# Patient Record
Sex: Female | Born: 1970
Health system: Southern US, Community
[De-identification: ages and names within clinical notes are randomized; demographics above are authoritative.]

## PROBLEM LIST (undated history)

## (undated) DIAGNOSIS — O24419 Gestational diabetes mellitus in pregnancy, unspecified control: Secondary | ICD-10-CM

## (undated) DIAGNOSIS — E049 Nontoxic goiter, unspecified: Secondary | ICD-10-CM

## (undated) DIAGNOSIS — D259 Leiomyoma of uterus, unspecified: Secondary | ICD-10-CM

## (undated) DIAGNOSIS — D649 Anemia, unspecified: Secondary | ICD-10-CM

## (undated) HISTORY — DX: Leiomyoma of uterus, unspecified: D25.9

## (undated) HISTORY — DX: Gestational diabetes mellitus in pregnancy, unspecified control: O24.419

## (undated) HISTORY — DX: Anemia, unspecified: D64.9

## (undated) HISTORY — DX: Nontoxic goiter, unspecified: E04.9

---

## 2000-08-21 ENCOUNTER — Encounter: Admission: RE | Admit: 2000-08-21 | Discharge: 2000-08-21 | Payer: Self-pay | Admitting: Internal Medicine

## 2000-12-28 ENCOUNTER — Encounter: Admission: RE | Admit: 2000-12-28 | Discharge: 2000-12-28 | Payer: Self-pay | Admitting: Internal Medicine

## 2001-04-21 ENCOUNTER — Other Ambulatory Visit: Admission: RE | Admit: 2001-04-21 | Discharge: 2001-04-21 | Payer: Self-pay | Admitting: Obstetrics and Gynecology

## 2001-09-06 ENCOUNTER — Encounter: Admission: RE | Admit: 2001-09-06 | Discharge: 2001-09-06 | Payer: Self-pay

## 2002-01-14 ENCOUNTER — Ambulatory Visit (HOSPITAL_COMMUNITY): Admission: AD | Admit: 2002-01-14 | Discharge: 2002-01-14 | Payer: Self-pay | Admitting: Obstetrics and Gynecology

## 2002-01-14 ENCOUNTER — Encounter (INDEPENDENT_AMBULATORY_CARE_PROVIDER_SITE_OTHER): Payer: Self-pay | Admitting: Specialist

## 2002-05-27 ENCOUNTER — Other Ambulatory Visit: Admission: RE | Admit: 2002-05-27 | Discharge: 2002-05-27 | Payer: Self-pay | Admitting: Obstetrics and Gynecology

## 2002-11-08 ENCOUNTER — Encounter: Admission: RE | Admit: 2002-11-08 | Discharge: 2002-11-08 | Payer: Self-pay | Admitting: Obstetrics and Gynecology

## 2002-12-02 ENCOUNTER — Inpatient Hospital Stay (HOSPITAL_COMMUNITY): Admission: AD | Admit: 2002-12-02 | Discharge: 2002-12-04 | Payer: Self-pay | Admitting: Obstetrics and Gynecology

## 2004-10-20 ENCOUNTER — Observation Stay (HOSPITAL_COMMUNITY): Admission: EM | Admit: 2004-10-20 | Discharge: 2004-10-21 | Payer: Self-pay | Admitting: Emergency Medicine

## 2004-10-20 IMAGING — CT CT PELVIS W/ CM
1 of 3 series · 15 of 32 positions shown, 20 images · IV contrast (omnipaque)
Comparison: none

CLINICAL DATA: Nausea; vomiting; diarrhea
TECHNIQUE: Images are obtained of the abdomen and pelvis during the IV infusion of 100 cc Omnipaque 300.  The patient vomited just prior to the study. 
 CT ABDOMEN WITH CONTRAST:
 Lung bases are clear.  The liver, spleen, gallbladder, pancreas, adrenal glands, and kidneys appear to be within normal limits.  No evidence for bowel obstruction, free fluid, or inflammatory changes.

[Series 3: abd/pelvis 5.0 b30f · axial · 0.59mm/px · z∈[-708,-353]mm · 15 of 81 slices shown, 20 images]
[im 5/81  soft-tissue]
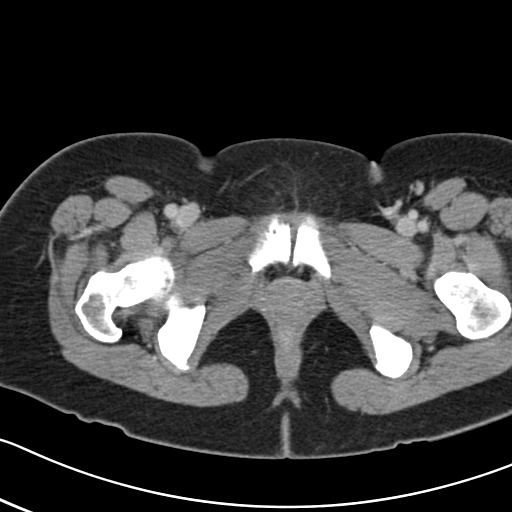
[im 5/81  bone]
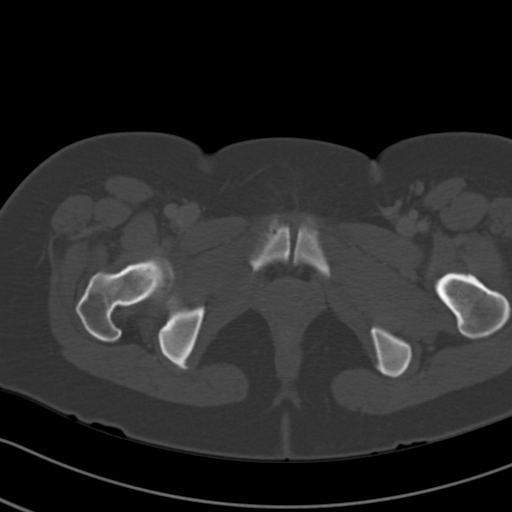
[im 9/81  soft-tissue]
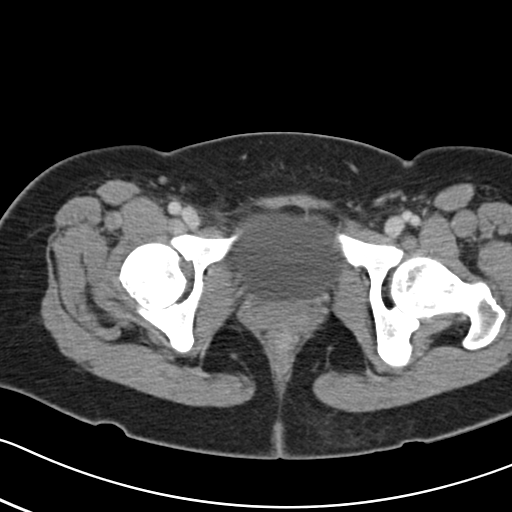
[im 17/81  soft-tissue]
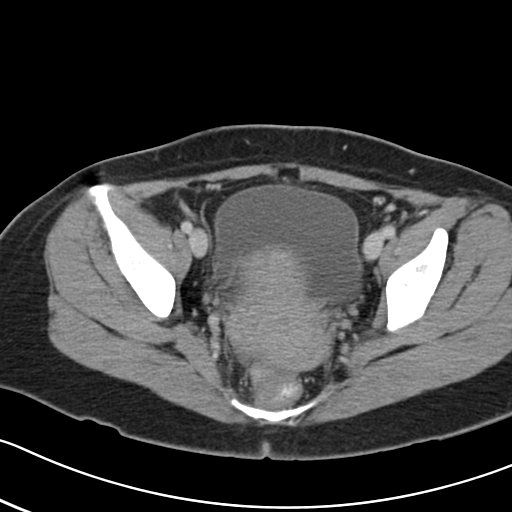
[im 22/81  soft-tissue]
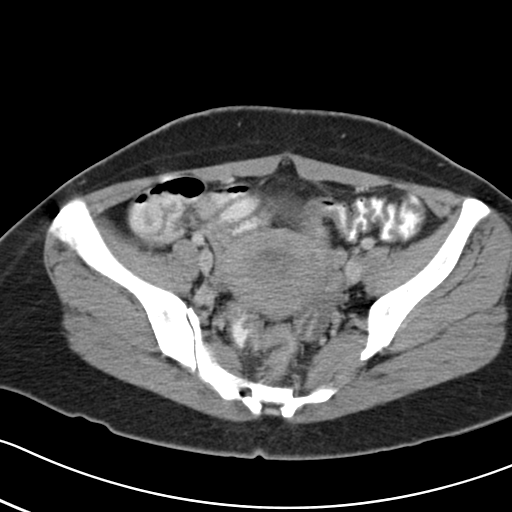
[im 26/81  soft-tissue]
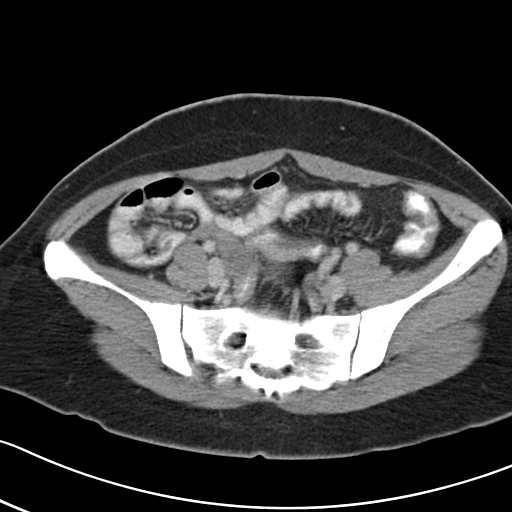
[im 34/81  soft-tissue]
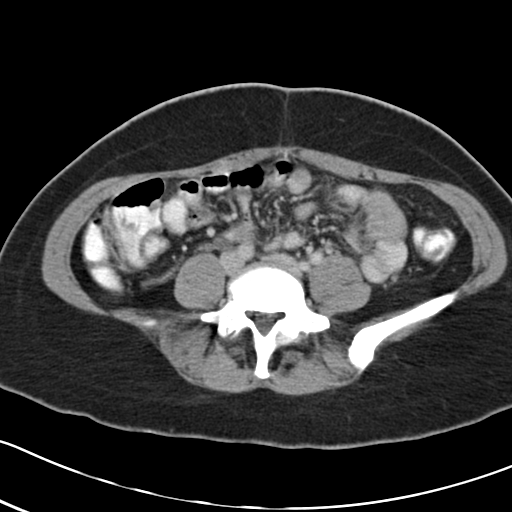
[im 38/81  soft-tissue]
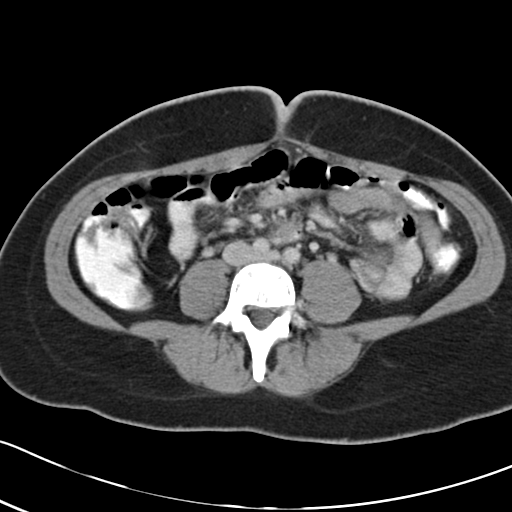
[im 43/81  soft-tissue]
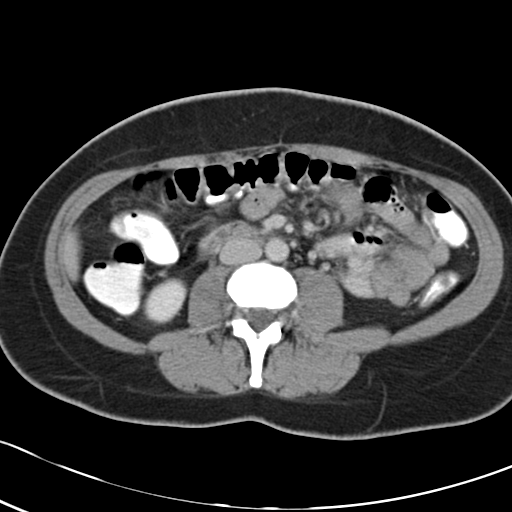
[im 47/81  soft-tissue]
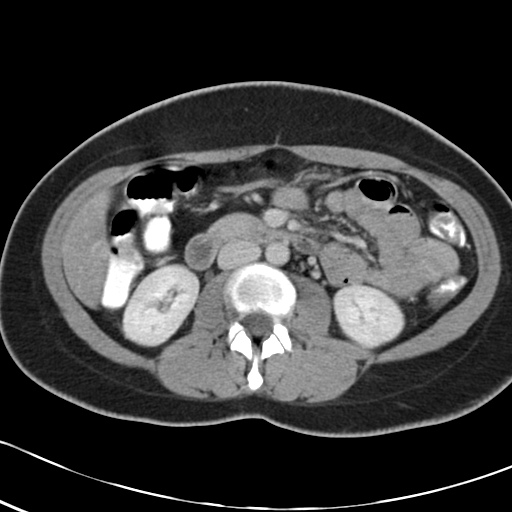
[im 47/81  bone]
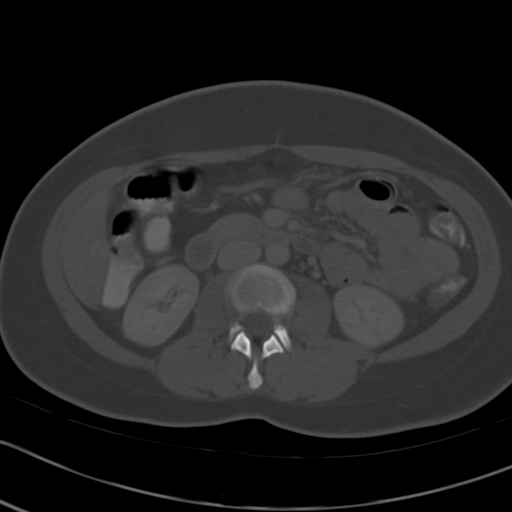
[im 55/81  soft-tissue]
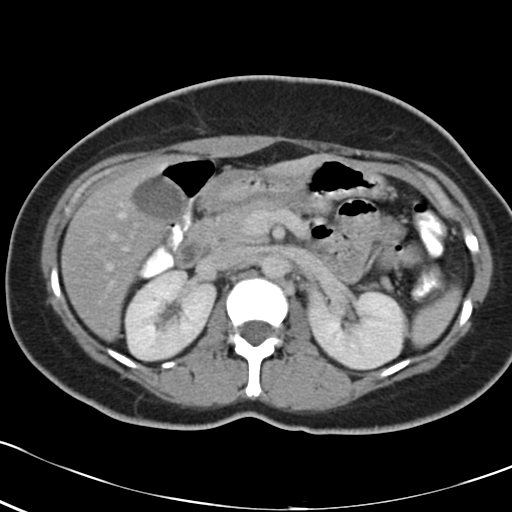
[im 59/81  soft-tissue]
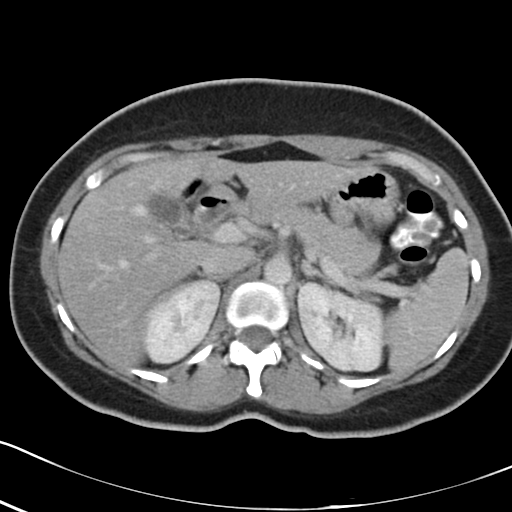
[im 64/81  soft-tissue]
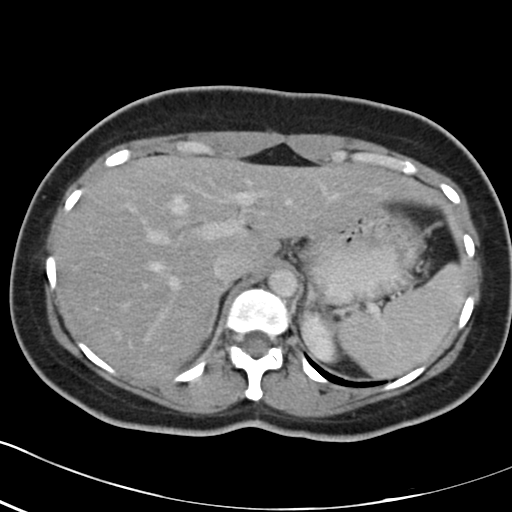
[im 64/81  lung]
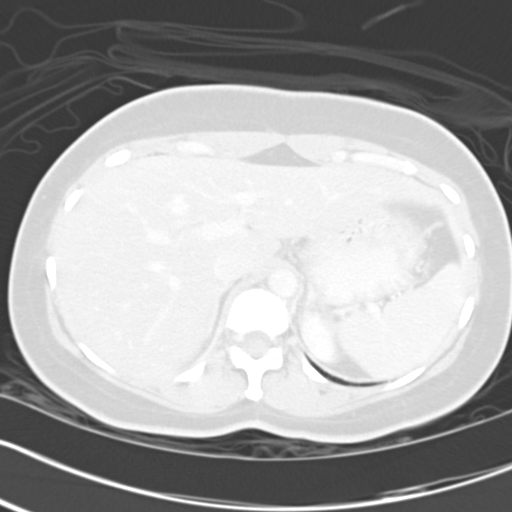
[im 68/81  lung]
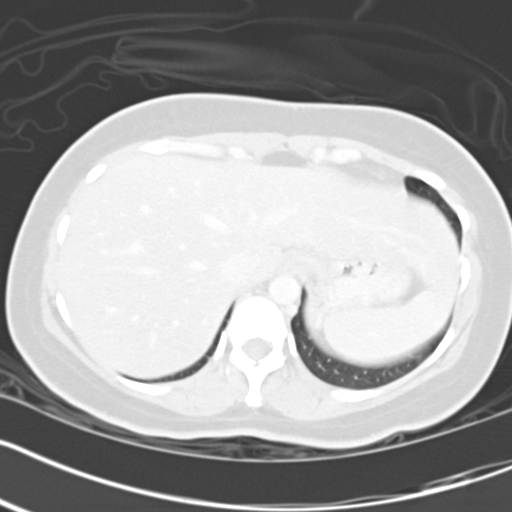
[im 72/81  soft-tissue]
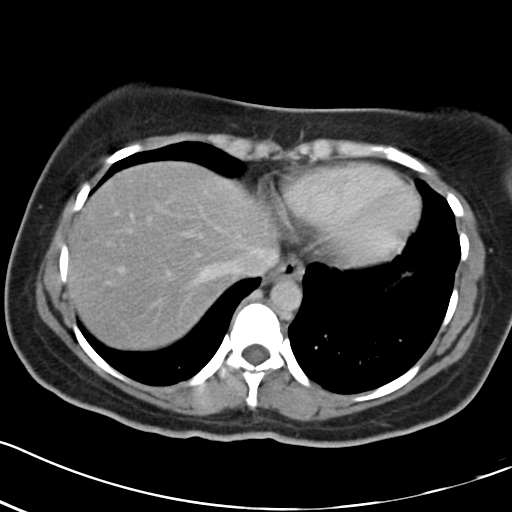
[im 72/81  lung]
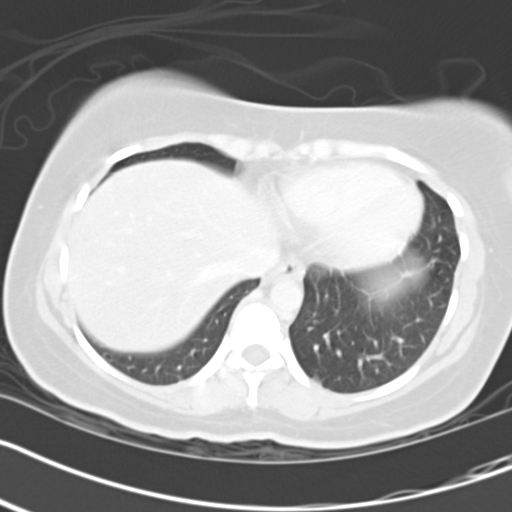
[im 76/81  soft-tissue]
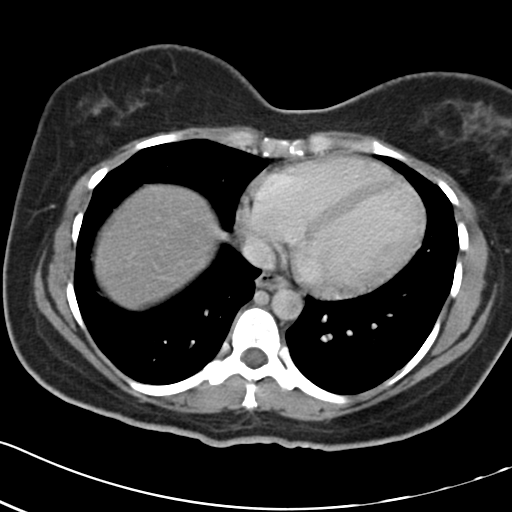
[im 76/81  lung]
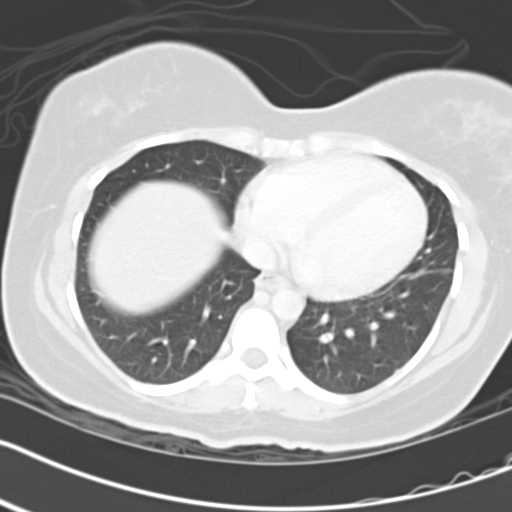

[15 of 32 positions shown; findings below may reference images not displayed]

IMPRESSION: No acute findings. 
 CT OF THE PELVIS WITH CONTRAST:
 The bladder, uterus, and adnexal regions appear normal.  A tiny amount of pelvic free fluid is present.  No inflammatory changes within the right lower quadrant and no evidence for bowel obstruction.
IMPRESSION: Tiny amount of pelvic free fluid.

## 2005-10-20 ENCOUNTER — Other Ambulatory Visit: Admission: RE | Admit: 2005-10-20 | Discharge: 2005-10-20 | Payer: Self-pay | Admitting: Obstetrics and Gynecology

## 2006-01-08 ENCOUNTER — Encounter: Admission: RE | Admit: 2006-01-08 | Discharge: 2006-04-08 | Payer: Self-pay | Admitting: Obstetrics and Gynecology

## 2006-04-16 ENCOUNTER — Inpatient Hospital Stay (HOSPITAL_COMMUNITY): Admission: AD | Admit: 2006-04-16 | Discharge: 2006-04-16 | Payer: Self-pay | Admitting: Obstetrics and Gynecology

## 2006-06-02 ENCOUNTER — Inpatient Hospital Stay (HOSPITAL_COMMUNITY): Admission: RE | Admit: 2006-06-02 | Discharge: 2006-06-04 | Payer: Self-pay | Admitting: Obstetrics and Gynecology

## 2009-11-14 ENCOUNTER — Encounter: Admission: RE | Admit: 2009-11-14 | Discharge: 2009-11-14 | Payer: Self-pay | Admitting: Family Medicine

## 2011-04-04 NOTE — H&P (Signed)
Brandy Ramsey, Brandy Ramsey NO.:  [id number]   MEDICAL RECORD NO.:  [id number]          PATIENT TYPE:  INP   LOCATION:                                FACILITY:  [facility]   PHYSICIAN:  Janine Limbo, M.D.DATE OF BIRTH:  May 21, 1971   DATE OF ADMISSION:  06/02/2006  DATE OF DISCHARGE:                                HISTORY & PHYSICAL   HISTORY OF PRESENT ILLNESS:  Patient is a 40 year old female, gravida 3,  para 1, 0-1-1, who presents at [redacted] weeks gestation (EDC is June 15, 2006).  The patient has been followed at the [facility] and  Gynecology Division of [facility].  The pregnancy has  been complicated by the fact that her age is greater than 35.  The patient  had a CVS performed in [place] that showed a healthy infant.  Chromosome  count was normal.  The patient also has a history of gestational diabetes,  and her blood sugars have been extremely well controlled.   OBSTETRICAL HISTORY:  The patient had a first trimester miscarriage in 2003.  In 2004, she had a vaginal delivery at term, where she delivered a 5 pound,  9 ounce female infant.   DRUG ALLERGIES:  No known drug allergies.   PAST MEDICAL HISTORY:  The patient has a history of diabetes during this  pregnancy, and she had gestational diabetes with her first pregnancy.  She  had her wisdom teeth removed.   SOCIAL HISTORY:  The patient denies cigarette use, alcohol use, and  recreational drug use.   REVIEW OF SYSTEMS:  Normal pregnancy complaints.   FAMILY HISTORY:  Noncontributory.   PHYSICAL EXAMINATION:  VITAL SIGNS:  Weight 144 pounds.  HEENT:  Within normal limits.  CHEST:  Clear.  HEART:  Regular rate and rhythm.  BREASTS:  Without masses.  ABDOMEN:  Gravid with a fundal height of 37 cm.  EXTREMITIES:  Grossly normal.  NEUROLOGIC:  Grossly normal.  PELVIC:  Cervix is 3 cm dilated, 50% effaced, and -2 station.   LABORATORY VALUES:  Blood type is O+.   Antibody screen negative.  VDRL  nonreactive.  Rubella positive.  HBSAG negative.  Pap is within normal  limits.  Third trimester beta strep is negative.   ASSESSMENT:  1.  A 38-week gestation.  2.  Well controlled gestational diabetes.   PLAN:  The patient will undergo induction of labor.  She understands the  indications for her procedure, and she accepts the associated risks.      Janine Limbo, M.D.  Electronically Signed     AVS/MEDQ  D:  06/01/2006  T:  06/01/2006  Job:  [id number]

## 2011-04-04 NOTE — Op Note (Signed)
[name]  Patient:    Brandy Ramsey, Brandy Ramsey Visit Number: [id number] MRN: [id number]          Service Type: DSU Location: [facility] Attending Physician:  Jaymes Graff A Dictated by:   Pierre Bali Normand Sloop, M.D. Proc. Date: 01/14/02 Admit Date:  01/14/2002                             Operative Report  DATE OF BIRTH:                June 10, 1971  PREOPERATIVE DIAGNOSES:       Missed abortion in the first trimester.  POSTOPERATIVE DIAGNOSES:      Missed abortion in the first trimester.  PROCEDURE:                    Dilatation and evacuation.  SURGEON:                      Naima A. Normand Sloop, M.D.  ANESTHESIA:                   MAC with 20 cc 1% cervical block.  INTRAVENOUS FLUIDS:           1000 cc Crystalloid.  ESTIMATED BLOOD LOSS:         Minimal.  PATHOLOGY:                    Products of conception.  COMPLICATIONS:                None.  FINDINGS:                     A 7 week sized uterus.  Normal adnexa bilaterally.  Vulva/vaginal examination is within normal limits.  Abdomen is soft and nontender.  DISPOSITION:                  Patient went to recovery room in stable condition.  PROCEDURE IN DETAIL:          Patient was taken to the operating room where she was given MAC anesthesia, placed in the dorsal lithotomy position, prepped and draped in a normal sterile fashion.  A bivalve speculum was placed into the vagina.  The anterior lip of the cervix was grasped with a single tooth tenaculum.  Cervical block was then done with 1% lidocaine, 20 cc total.  The cervix was then dilated with Shawnie Pons dilators up to a 21.  A size 7 suction curettage was then placed into the uterus.  Curettage was done until a gritty texture was noted and no more products of conception were seen, evacuating the uterus.  A sharp curettage was then placed into the uterine cavity and a gritty texture was noted.  It was noted to be a normal cavity.  All instruments  were removed from the vagina.  The tenaculum site was made hemostatic with pressure.  Sponge, lap, and instrument counts were correct x2. Before the bivalve speculum was placed the bladder was drained of about 50 cc of urine.  The patient was examined and noted to have a normal abdominal examination, a 7 week sized uterus with no adnexal masses.  Patient went to recovery room in stable condition. Dictated by:   Pierre Bali. Normand Sloop, M.D. Attending Physician:  Michael Litter DD:  01/14/02 TD:  01/14/02 Job: [id number]

## 2011-04-04 NOTE — H&P (Signed)
NAMEETHELEAN, COLLA NO.:  [id number]   MEDICAL RECORD NO.:  [id number]          PATIENT TYPE:  INP   LOCATION:  0474                         FACILITY:  [facility]   PHYSICIAN:  Lenon Curt. Chilton Si, M.D.  DATE OF BIRTH:  03-Nov-1971   DATE OF ADMISSION:  10/20/2004  DATE OF DISCHARGE:                                HISTORY & PHYSICAL   CHIEF COMPLAINT:  Nausea, vomiting, diarrhea.   HISTORY OF PRESENT ILLNESS:  A 40 year old female with a history of anemia  who presented to [facility] Emergency Department via EMS  complaining of nausea, vomiting, diarrhea that started this morning.  She  felt fine prior to this morning.  She denies any sick contacts at home and  has been exposed to C. difficile colitis and various other infections at the  hospital.  She denies any fevers but has had chills today.  She has been  unable to keep down foods and liquids at home and had lightheadedness with  standing from a seated position.  Her stools have been water and nonbloody.  She denies any abdominal pain but did have mild cramping.  She received  three liters of IV fluids in the emergency department and had some  hypotension.   She continued to have diarrhea despite medications given in the ED.   PAST MEDICAL HISTORY:  1.  Anemia.  2.  Thyromegaly with normal TSH.  3.  Astigmatism.   PAST SURGICAL HISTORY:  1.  D&C.  2.  Vaginal delivery.   MEDICATIONS:  1.  Iron sulfate.  2.  Imodium taken today.   EMERGENCY DEPARTMENT MEDICATIONS:  1.  Phenergan IV 3 L of normal saline.  2.  Tincture of opium.  3.  Two doses of Lomotil.   ALLERGIES:  No known drug allergies.   FAMILY HISTORY:  Mom has diabetes, hyperlipidemia, and hypertension.  Father  has hypertension.  She has three brothers who are all healthy.   SOCIAL HISTORY:  She is married with a 32-year-old daughter.  No sick  contacts at home.  She is a Development worker, community at [employer]  She is not a  smoker.   She denies any alcohol or recent travel.  She did have her flu shot  this year.   REVIEW OF SYMPTOMS:  CONSTITUTIONAL:  She denies having any fevers but did  have chills.  She has been nauseated but feeling better currently.  Positive  for vomiting with last episode at 9 p.m.  Positive for diarrhea x 20 today  which has been watery, nonmucosy and nonbloody.  Denies any headache,  melena.  Positive for mild abdominal cramping.  Positive for regular menses  with her last menstrual period three weeks ago.  It was heavy lasting seven  days.   PHYSICAL EXAMINATION:  VITAL SIGNS:  Temperature maximum is 99.2, pulse of  111 down to 94, respiratory rate of 18, blood pressure 112/74 down to 77/42  then to 105/59.  GENERAL:  A pleasant female in no acute distress in bed.  Alert and oriented  x 3.  HEENT:  Head is normocephalic and  atraumatic.  Wears glasses.  Pupils are  equal, round, and reactive.  Oropharynx pink and moist.  CARDIOVASCULAR:  Regular rate and rhythm without murmurs.  There are 2+  pulses.  NECK:  Supple without lymphadenopathy or thyromegaly.  LUNGS:  Clear to auscultation bilaterally and nonlabored.  No wheezing,  rales, or rhonchi.  ABDOMEN:  Soft, periumbilical tenderness to palpation.  No rebound,  guarding, or rigidity.  No hepatosplenomegaly.  No CVAT.  Hyperactive bowel  sounds.  SKIN:  No diaphoresis or pallor.  NEUROLOGICAL:  Cranial nerves II through XII grossly intact.  GENITOURINARY:  DRE with no stool in the rectal vault, hemoccult negative.   LABORATORY DATA:  Stool negative for WBC.  C. difficile and ovum parasites  are pending.  There are 0 to 2 wbc's and 0 to 2 rbc's.  Urine pregnancy is  negative.  Urinalysis is negative except for trace ketones and trace  leukocyte esterase.  Lipase is 23, amylase is 140.  CMET shows a sodium of  135, potassium of 4.4, chloride 107, bicarbonate 19, BUN 13, creatinine 0.9,  glucose of 129.  LFTs are all within normal  limits.  Albumin is 5, total  protein is 8.9, alkaline phosphate is 79, total bilirubin is 1.1.  White  count is 13.1, hemoglobin is 14.6, hematocrit is 44.7, platelet count is  340,000.  There are 92% neutrophils and 4% lymphocytes.   CT of the abdomen and pelvis shows a tiny amount of free fluid in the  pelvis.   ASSESSMENT:  A 40 year old female admitted for dehydration, nausea,  vomiting, and diarrhea.   PLAN:  1.  Dehydration:  Status post three liters of IV fluid in the emergency      room.  We will run her on maintenance IV fluids tonight and monitor      I&Os.  Monitor her blood pressure.  Slow p.o. intake encouraged.  2.  Nausea, vomiting, and diarrhea:  Differential diagnosis includes      gastroenteritis, infection colitis:  Infectious etiology is the likely      cause as she is young, healthy, and works in the health care industry.      She has been treated with Phenergan which has improved her nausea and      she had no emesis for four hours.  Her diarrhea continues to spike with      tincture of opium and two doses of Lomotil.  Look for infectious      etiology in the stool as her C. difficile and cultures are pending.      Consider starting treatment with Flagyl and Cipro.  3.  Hypotension:  This has improved with IV fluids.  This is most likely      secondary to volume loss from vomiting      and diarrhea with inadequate p.o. intake.  Continue to push IV fluids as      needed if the blood pressure continues to drop.  Sepsis is unlikely      given her afebrile status.  She does have mildly increased wbc's to      13,000.  4.  The patient will likely be discharged tomorrow if her blood pressure is      stable and able to keep in fluids.     Kare   KB/MEDQ  D:  10/21/2004  T:  10/21/2004  Job:  [id number]   cc:   Lenon Curt. Chilton Si, M.D.  [address]  [place]  [place] [phone]  Fax: [phone]

## 2011-04-04 NOTE — Discharge Summary (Signed)
   NAME:  Brandy Ramsey, Brandy Ramsey                     ACCOUNT NO.:  [id number]   MEDICAL RECORD NO.:  [id number]                   PATIENT TYPE:  INP   LOCATION:  9134                                 FACILITY:  [facility]   PHYSICIAN:  Naima A. Dillard, M.D.              DATE OF BIRTH:  02-Oct-1971   DATE OF ADMISSION:  12/02/2002  DATE OF DISCHARGE:  12/04/2002                                 DISCHARGE SUMMARY   ADMISSION DIAGNOSES:  1. Intrauterine pregnancy at term.  2. Premature rupture of membranes.  3. Gestational diabetes.   PROCEDURES:  1. Vacuum extraction vaginal delivery.  2. Repair of third-degree perineal laceration.   DISCHARGE DIAGNOSES:  1. Intrauterine pregnancy at term delivered.  2. Premature rupture of membranes.  3. Gestational diabetes.  4. Vacuum extraction vaginal delivery.  5. Repair of third-degree perineal laceration.  6. Maternal exhaustion.   HOSPITAL COURSE:  The patient is a 40 year old gravida 2, para 0-0-1-0 who  presented at 37 weeks with spontaneous rupture of membranes at home for  clear fluid.  Her labor was augmented with Pitocin and she progressed  normally to complete dilation and plus 3 station.  She pushed for three  hours with maternal exhaustion and she consented to a vacuum extractor  assistance for delivery.  This was accomplished by Naima A. Dillard, M.D.  with the birth of a 5 pound 9 ounce female infant with Apgar scores of 9 at  one minute and 9 at five minutes.  The patient has done quite well in the  immediate postpartum period.  Her hemoglobin on the first postpartum day was  9.7.  Her vital signs remained stable.  On her first postpartum day, her  fasting blood sugar was 126.  This a.m. it is 82.  She is judged to be in  satisfactory condition for discharge.   DISCHARGE INSTRUCTIONS:  Per [facility] handout.   DISCHARGE MEDICATIONS:  1. Motrin 600 mg p.o. q.6 h. p.r.n. pain.  2. Tylox 1-2 p.o. q.3-4 h. pain.  3. Prenatal  vitamins.   DISCHARGE FOLLOW UP:  Will be at [facility] in 6 weeks.     Rica Koyanagi, C.N.M.               Naima A. Normand Sloop, M.D.    SDM/MEDQ  D:  12/04/2002  T:  12/04/2002  Job:  [id number]

## 2011-04-04 NOTE — H&P (Signed)
NAME:  Brandy Ramsey, QUAST NO.:  [id number]   MEDICAL RECORD NO.:  [id number]                   PATIENT TYPE:  INP   LOCATION:  9162                                 [facility]ACILITY:  [facility]   PHYSICIAN:  Crist Fat. Rivard, M.D.              DATE OF BIRTH:  February 02, 1971   DATE OF ADMISSION:  12/02/2002  DATE OF DISCHARGE:                                HISTORY & PHYSICAL   HISTORY OF PRESENT ILLNESS:  This is a 40 year old gravida 2, para 0-0-1-0  at 37-1/7 weeks, who presents with complaints of gush of fluids at 3 o'clock  a.m. with a few cramps thereafter.  She reports positive fetal movement and  no bleeding.   PRESENT PREGNANCY:  The pregnancy has been followed by the M.D. service and  remarkable for:  1. Gestational diabetes.  2. The patient is an M.D.  3. Group B strep positive.   OBSTETRICAL HISTORY:  Remarkable for a missed AB in February 2003, with a  D&E.   PRENATAL LABS:  Hemoglobin 11.8, platelets 275, blood type 0 positive,  antibody screen negative.  Sickle cell declined.  RPR nonreactive.  Rubella  immune.  HBSAG negative.  Cultures negative.  A 3-hr GTT elevated.  AFP  within normal limits.  Group B strep positive.   PAST MEDICAL HISTORY:  Remarkable for:  1. Childhood varicella.  2. History of anemia.   PAST SURGICAL HISTORY:  Remarkable for wisdom tooth extraction and D&E.   FAMILY HISTORY:  Remarkable for both parents with hypertension.  Mother with  diabetes and an aunt with goiter.   GENETIC HISTORY:  Remarkable for the father of the baby's first cousin with  Down syndrome.   SOCIAL HISTORY:  The patient is married to [place], who is involved and  supportive.  She is Buddhist.  She denies any alcohol, tobacco or drug use.   OBJECTIVE DATA:  VITAL SIGNS:  Stable, afebrile.  HEENT:  Within normal limits.  NECK:  Thyroid normal and not enlarged.  CHEST:  Clear to auscultation.  HEART:  Regular rate and rhythm.  ABDOMEN:  Gravid at 37  cm; vertex to Leopold's.  AFM shows a reassuring  fetal heart rate, with occasional uterine contractions.  PELVIC:  Vaginal examination deferred, but there is gross evidence of  ruptured membranes with clear fluid in vernix.  EXTREMITIES:  Within normal limits.   LABORATORY DATA:  Fasting blood sugar at home this morning was 70.   ASSESSMENT:  1. Intrauterine pregnancy at 37-1/7 weeks.  2. Diet-controlled gestational diabetes.  3. Premature rupture of membranes at term.  4. Late in phase labor.   PLAN:  1. Admit to M.D. service, Dr. Dois Davenport Rivard notified.  2. Routine M.D. orders.  3. Briefly discussed expected management versus induction of labor, and the     patient states that she does not have a preference.  Dr. Jaymes Graff     will  assess her and make further orders.     Marie L. Williams, C.N.M.                 Crist Fat Rivard, M.D.    MLW/MEDQ  D:  12/02/2002  T:  12/02/2002  Job:  [id number]

## 2011-04-04 NOTE — Op Note (Signed)
   NAME:  Brandy Ramsey, Brandy Ramsey                     ACCOUNT NO.:  [id number]   MEDICAL RECORD NO.:  [id number]                   PATIENT TYPE:  INP   LOCATION:  9134                                 FACILITY:  [facility]   PHYSICIAN:  Naima A. Dillard, M.D.              DATE OF BIRTH:  [birth year]   DATE OF PROCEDURE:  12/02/2002  DATE OF DISCHARGE:                                 OPERATIVE REPORT   PROCEDURE:  Vacuum-assisted vaginal delivery.   DESCRIPTION OF PROCEDURE:  The patient progressed to complete, +3 station,  left occiput anterior.  The patient pushed for three hours but had maternal  exhaustion.  Her bladder was drained of 500 cubic centimeters, and she and  her husband were consented for a vacuum-assisted vaginal delivery.  They  were told the risks are but are not limited to bleeding, scalp injury,  cephalohematoma, intraventricular hemorrhage.  Patient and family agreed to  a vacuum-assisted vaginal delivery.  A Kiwi vacuum was placed in correct  position and after two pulls and no pop-offs at 500 mmHg in the green zone,  the head delivered to perineum, suction was released.  Mouth and nares were  bulb-suctioned.  No nuchal cord, no meconium.  Body and head in compound  presentation and delivered without difficulty.  Placenta delivered intact  with three-vessel cord.  There was a partial third degree laceration, which  was repaired with 0 Vicryl and 2-0 chromic in a normal fashion.  EBL was 300  cubic centimeters.  Apgars were 9 and 9.  The patient recovered normally in  labor and delivery.                                               Naima A. Normand Sloop, M.D.    NAD/MEDQ  D:  12/02/2002  T:  12/04/2002  Job:  [id number]

## 2011-04-04 NOTE — Discharge Summary (Signed)
Brandy Ramsey, Brandy Ramsey NO.:  [id number]   MEDICAL RECORD NO.:  [id number]          PATIENT TYPE:  INP   LOCATION:  9132                          [facility]ACILITY[facility]   PHYSICIAN:  Janine Limbo, M.D.DATE OF BIRTH:  Oct 14, 1971   DATE OF ADMISSION:  06/02/2006  DATE OF DISCHARGE:  06/04/2006                                 DISCHARGE SUMMARY   ADMISSION DIAGNOSES:  1.  Intrauterine pregnancy at 38 weeks.  2.  Gestational diabetes, well-controlled.   DISCHARGE DIAGNOSES:  1.  Intrauterine pregnancy at 38 weeks.  2.  Gestational diabetes, well-controlled.  3.  Arrested second stage of labor.  4.  Maternal fatigue.  5.  Second-degree laceration.  6.  Status post vacuum extraction vaginal delivery of a viable female infant      weighing 6 pounds 12 ounces, Apgars 8 and 9.  7.  Meconium-stained fluid.   PROCEDURES:  1.  Amniotomy induction of labor.  2.  Amnioinfusion.  3.  Vacuum extraction vaginal delivery.  4.  Repair of second-degree midline laceration.  5.  Epidural anesthesia.   HOSPITAL COURSE:  The patient was admitted for induction of labor.  Amniotomy was performed.  IUPC was placed.  She progressed quickly to  complete dilation.  She did develop meconium-stained fluid and received an  amnioinfusion for that.  She pushed for awhile and became fatigued.  The  baby did not descend, so the decision was made to proceed with a vacuum  extraction delivery which was performed under epidural anesthesia by Dr.  Stefano Gaul, with no complications.  A second-degree midline laceration was  repaired.  The baby did well, had Apgars of 8 and 9, and was taken to the  nursery.  Mother was taken to the mother/baby unit where she did well.   Fasting blood sugar on day #1 was 79.  She was up and about.  Hemoglobin was  9.7.  She was not having any problems with orthostasis and was breast  feeding well.   On postpartum day #2, she was ready to go home.  She was breast  feeding  well.  She was up and about.  Voiding quantity was sufficient.  Vital signs  were stable.  Fasting blood sugar was 95.  Chest was clear.  Heart rate was  regular rate and rhythm.  Fundus was firm.  Lochia was within normal limits.  Extremities were within normal limits.  She was deemed to have received full  benefit of her hospital stay and was discharged home.   DISCHARGE MEDICATIONS:  1.  Motrin 600 mg p.o. q.6h. p.r.n.  2.  Vicodin one to two p.o. q.4h. p.r.n.   DISCHARGE LABORATORY DATA:  White blood cell count 10.2, hemoglobin 9.7,  platelets 218.   DISCHARGE INSTRUCTIONS:  Per [facility] handout.   FOLLOW UP:  Discharge followup in six weeks or p.r.n.      Marie L. Williams, C.N.M.      Janine Limbo, M.D.  Electronically Signed    MLW/MEDQ  D:  06/04/2006  T:  06/04/2006  Job:  [id number]

## 2011-04-04 NOTE — Op Note (Signed)
NAMEMAEVEN, MCDOUGALL NO.:  [id number]   MEDICAL RECORD NO.:  [id number]          PATIENT TYPE:  INP   LOCATION:  9132                          FACILITY:  [facility]   PHYSICIAN:  Janine Limbo, M.D.DATE OF BIRTH:  1971-09-17   DATE OF PROCEDURE:  06/02/2006  DATE OF DISCHARGE:                                 OPERATIVE REPORT   PREOPERATIVE DIAGNOSES:  1.  Term intrauterine pregnancy.  2.  Meconium-stained amniotic fluid.  3.  Arrested second stage of labor.  4.  Maternal exhaustion.   POSTOPERATIVE DIAGNOSIS:  1.  Term intrauterine pregnancy.  2.  Meconium-stained amniotic fluid.  3.  Arrested second stage of labor.  4.  Maternal exhaustion.  5.  Second-degree midline laceration.   PROCEDURE:  1.  Vacuum extraction vaginal delivery.  2.  Repair of second-degree midline laceration.   OBSTETRICIAN:  Dr. Leonard Schwartz.   ANESTHETIC:  Was epidural.   DISPOSITION:  The patient is a 40 year old female, gravida 3, para 1-0-1-1,  who presents at [redacted] weeks gestation for induction of labor.  The pregnancy  has been complicated by gestational diabetes that has been well controlled.  The patient was started on Pitocin on the morning of June 02, 2006.  Her  membranes were ruptured at approximately 1:15 p.m.  The patient dilated her  cervix completely.  She pushed for 2 hours, but became quite fatigued from  the pushing process.  The fetal head would not descend beyond a +3 station.  We discussed options for management which included continued pushing,  cesarean delivery, and operative vaginal delivery.  The patient elected to  proceed with operative vaginal delivery.  We discussed the benefits of  vacuum extraction vaginal delivery and forceps vaginal delivery.  The  patient elected to proceed with vacuum extraction vaginal delivery.  The  specific risk associated with vacuum extraction vaginal delivery were  reviewed including caput formation, hematoma  formation, the rare risk of  intracranial bleeding, and the risk that the procedure will be unsuccessful  and that we would still need to proceed with cesarean delivery.  The patient  understood those risks and was ready to proceed.   FINDINGS:  The weighted the infant is currently not known.  A female infant  was delivered.  The Apgars were 8 at one minute and 9 at five minutes.  There was a second-degree midline laceration noted.  The infant had DeLee on  the perineum, and there was no evidence of meconium beneath the vocal cords.   PROCEDURE:  The patient was placed in a lithotomy position in the labor and  delivery suite.  The patient's perineum was prepped with multiple layers of  Betadine.  The bladder was drained of urine.  The fetal head was noted to be  a +3 station.  The cervix was completely dilated and 100% effaced.  The Kiwi  vacuum extractor was placed.  The patient pushed one time, and the fetal  head was delivered without difficulty.  The infant was an occiput anterior  presentation.  The mouth and nose were suctioned with the DeLee trap.  The  patient was then allowed to push, and the remainder of the infant was  delivered.  The cord was clamped, and cut and the infant cried  spontaneously.  The pediatric team was present but felt there was no need  for further evaluation because of the fluid from the DeLee trap was noted to  be clear.  The infant was placed on the mother's abdomen for bonding.  The  midline laceration was repaired in a standard fashion using 2-0 Vicryl.  Routine cord blood studies were obtained.  The placenta was removed, and the  placenta was given to the cord blood registry team.  Hemostasis was noted to  be adequate.  The patient was returned to the supine position.  The infant  was allowed to remain in the room with the mother for bonding.      Janine Limbo, M.D.  Electronically Signed     AVS/MEDQ  D:  06/02/2006  T:  06/03/2006  Job:   [id number]

## 2011-04-04 NOTE — H&P (Signed)
[facility]  Patient:    Brandy Ramsey, Brandy Ramsey Visit Number: [id number] MRN: [id number]          Service Type: Attending:  Naima A. Normand Sloop, M.D. Dictated by:   Pierre Bali. Normand Sloop, M.D. Adm. Date:  01/13/02                           History and Physical  DATE OF BIRTH:                November 17, 1970  HISTORY:                      Patient is a 40 year old female G1, P0 whose last menstrual period was October 29, 2001.  Should have an estimated gestational age of [redacted] weeks 6 days.  Came in to talk about the preoperative visit of dilatation and evacuation.  Patient was just diagnosed with a missed abortion yesterday and has decided to have a dilatation and evacuation. Patient denies any vaginal bleeding or cramping.  PAST MEDICAL HISTORY:         None.  PAST SURGICAL HISTORY:        None.  MEDICATIONS:                  Prenatal vitamins and iron sulfate.  ALLERGIES:                    No known drug allergies.  SOCIAL HISTORY:               Negative x3.  FAMILY HISTORY:               Significant for hypertension and diabetes in both parents.  No GYN CA.  PHYSICAL EXAMINATION  VITAL SIGNS:                  Blood pressure 106/60, weight 122 pounds.                                Patient desired to wait for physical examination and pelvic examination tomorrow before the procedure.  She had an ultrasound yesterday which showed a fetal pole measuring 8 weeks 5 days with no cardiac activity and the amnion was also seen with a small subchorionic hemorrhage. Both ovaries were normal.  There was a 30 minute discussion with the patient and her husband about the choice of doing expectant management versus a D&E. She was told the risks of a D&E are, but not limited to, bleeding, infection, perforation of the uterus and Ashermans syndrome.  Patient has chosen to have a D&E and will have one tomorrow. Dictated by:   Pierre Bali. Normand Sloop, M.D. Attending:  Naima  A. Dillard, M.D. DD:  01/13/02 TD:  01/13/02 Job: [id number]

## 2011-11-18 DIAGNOSIS — D259 Leiomyoma of uterus, unspecified: Secondary | ICD-10-CM

## 2011-11-18 HISTORY — DX: Leiomyoma of uterus, unspecified: D25.9

## 2011-12-08 ENCOUNTER — Encounter: Payer: Self-pay | Admitting: Family Medicine

## 2011-12-08 ENCOUNTER — Encounter (INDEPENDENT_AMBULATORY_CARE_PROVIDER_SITE_OTHER): Payer: Managed Care, Other (non HMO) | Admitting: Family Medicine

## 2011-12-08 DIAGNOSIS — D509 Iron deficiency anemia, unspecified: Secondary | ICD-10-CM | POA: Insufficient documentation

## 2011-12-08 DIAGNOSIS — E049 Nontoxic goiter, unspecified: Secondary | ICD-10-CM

## 2011-12-08 DIAGNOSIS — Z0000 Encounter for general adult medical examination without abnormal findings: Secondary | ICD-10-CM

## 2011-12-23 ENCOUNTER — Telehealth: Payer: Self-pay

## 2011-12-23 NOTE — Telephone Encounter (Signed)
Patient is calling wanting her pap smear results, she is also needing information on a referral that's being done to send her for an ultra sound.

## 2011-12-26 NOTE — Telephone Encounter (Signed)
Pt called back, and stated she had received lab results in mail but not pap. Gave pt neg results and checked on her [place] referral. Told pt ref info has been faxed to [facility] and Olegario Messier should be calling her w appt. Gave pt phone # it she wants to call and check with Olegario Messier. Pt agreed.

## 2011-12-29 ENCOUNTER — Other Ambulatory Visit: Payer: Self-pay | Admitting: Family Medicine

## 2011-12-29 DIAGNOSIS — D219 Benign neoplasm of connective and other soft tissue, unspecified: Secondary | ICD-10-CM

## 2012-01-21 ENCOUNTER — Other Ambulatory Visit: Payer: Self-pay | Admitting: Family Medicine

## 2012-01-21 ENCOUNTER — Other Ambulatory Visit: Payer: Self-pay | Admitting: Physician Assistant

## 2012-01-21 DIAGNOSIS — D219 Benign neoplasm of connective and other soft tissue, unspecified: Secondary | ICD-10-CM

## 2012-01-23 ENCOUNTER — Ambulatory Visit
Admission: RE | Admit: 2012-01-23 | Discharge: 2012-01-23 | Disposition: A | Discharge status: Home / Self Care | Payer: Managed Care, Other (non HMO) | Source: Ambulatory Visit | Attending: Family Medicine | Admitting: Family Medicine

## 2012-01-23 DIAGNOSIS — D219 Benign neoplasm of connective and other soft tissue, unspecified: Secondary | ICD-10-CM

## 2012-10-08 ENCOUNTER — Ambulatory Visit (INDEPENDENT_AMBULATORY_CARE_PROVIDER_SITE_OTHER): Payer: Managed Care, Other (non HMO) | Admitting: Family Medicine

## 2012-10-08 ENCOUNTER — Encounter: Payer: Self-pay | Admitting: Family Medicine

## 2012-10-08 VITALS — BP 125/85 | HR 77 | Temp 97.9°F | Resp 16 | Ht 60.5 in | Wt 130.0 lb

## 2012-10-08 DIAGNOSIS — Z79899 Other long term (current) drug therapy: Secondary | ICD-10-CM

## 2012-10-08 DIAGNOSIS — I10 Essential (primary) hypertension: Secondary | ICD-10-CM

## 2012-10-08 MED ORDER — HYDROCHLOROTHIAZIDE 25 MG PO TABS: 25.0000 mg | ORAL_TABLET | Freq: Every day | ORAL | Status: DC

## 2012-10-08 NOTE — Progress Notes (Signed)
Subjective:    Patient ID: Brandy Ramsey, female    DOB: 08/13/1971, 41 y.o.   MRN: [id number] Chief Complaint  Patient presents with  . 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  Work has been very stressful lately as there have been a lot of changes since the practice has been absorbed by [employer].  Can feel her adrenolin surge with stress - tries to exercise and meditate - but noticed that her BP has been going up.  She is checking it daily at work and recording it which she brought to the clinic today.  Wonders if she should she take a BP medicine even though her high BP is likely a direct results of job stress. Does not see job stress ending - they are going to transition from [facility] to [employer] within the next few mos. Drinks water throughout day, 2-3 cups caffeine.  Never needed  BP med prev.  Has never been on medicaiton for stress before and doesn't think an antidepressant or anxiety med would help.  getting HAs with stress - Tyelnol and motrin not helping - that is how she first suspected her BP might be up.  BP going up to 160s/90s at times but back down to 120s/80s when off of work.  Past Medical History  Diagnosis Date  . Anemia   . Thyroid enlargement    Current Outpatient Prescriptions on File Prior to Visit  Medication Sig Dispense Refill  . calcium carbonate (OS-CAL) 600 MG TABS Take 600 mg by mouth 2 (two) times daily with a meal.      . ferrous fumarate (HEMOCYTE - 106 MG FE) 325 (106 FE) MG TABS Take 1 tablet by mouth 2 (two) times daily.      . hydrochlorothiazide (HYDRODIURIL) 25 MG tablet Take 1 tablet (25 mg total) by mouth daily.  90 tablet  3   No Known Allergies   Review of Systems  Constitutional: Positive for fatigue. Negative for fever, chills, diaphoresis and appetite change.  Eyes: Negative for visual disturbance.  Respiratory: Negative for cough and  shortness of breath.   Cardiovascular: Negative for chest pain, palpitations and leg swelling.  Genitourinary: Negative for decreased urine volume.  Neurological: Positive for headaches. Negative for syncope.  Hematological: Does not bruise/bleed easily.  Psychiatric/Behavioral: Negative for sleep disturbance, dysphoric mood, decreased concentration and agitation. The patient is nervous/anxious. The patient is not hyperactive.       BP 125/85  Pulse 77  Temp 97.9 F (36.6 C)  Resp 16  Ht 5' 0.5" (1.537 m)  Wt 130 lb (58.968 kg)  BMI 24.97 kg/m2 Objective:   Physical Exam  Constitutional: She is oriented to person, place, and time. She appears well-developed and well-nourished. No distress.  HENT:  Head: Normocephalic and atraumatic.  Right Ear: External ear normal.  Left Ear: External ear normal.  Eyes: Conjunctivae normal are normal. No scleral icterus.  Neck: Normal range of motion. Neck supple. No thyromegaly present.  Cardiovascular: Normal rate, regular rhythm, normal heart sounds and intact distal pulses.   Pulmonary/Chest: Effort normal and breath sounds normal. No respiratory distress.  Musculoskeletal: She exhibits no edema.  Lymphadenopathy:    She has no cervical adenopathy.  Neurological: She is alert and oriented to person, place, and time.  Skin: Skin is warm and dry. She is not  diaphoretic. No erythema.  Psychiatric: She has a normal mood and affect. Her behavior is normal.      Assessment & Plan:  Episodic HTN  Ok to start hctz 25mg  as I suspect anxiety about her elev BP is making her BP worse. I do not think she actually has HTN - more that she is very anxious and stressed about work that is causing elev BP but pt declines psych med and I agree that this prob wouldn't fix anything.  She will cont to watch her BP outside of the office and return for a bmp 1-2 wks after starting hctz.

## 2012-12-03 ENCOUNTER — Encounter: Payer: Self-pay | Admitting: Family Medicine

## 2012-12-03 ENCOUNTER — Ambulatory Visit (INDEPENDENT_AMBULATORY_CARE_PROVIDER_SITE_OTHER): Payer: Managed Care, Other (non HMO) | Admitting: Family Medicine

## 2012-12-03 VITALS — BP 122/88 | HR 86 | Temp 98.9°F | Resp 16 | Ht 60.0 in | Wt 131.0 lb

## 2012-12-03 DIAGNOSIS — R7303 Prediabetes: Secondary | ICD-10-CM | POA: Insufficient documentation

## 2012-12-03 DIAGNOSIS — Z0000 Encounter for general adult medical examination without abnormal findings: Secondary | ICD-10-CM

## 2012-12-03 LAB — CBC WITH DIFFERENTIAL/PLATELET
Basophils Absolute: 0.1 10*3/uL (ref 0.0–0.1)
Basophils Relative: 1 % (ref 0–1)
Eosinophils Absolute: 0.1 10*3/uL (ref 0.0–0.7)
Eosinophils Relative: 2 % (ref 0–5)
HCT: 37 % (ref 36.0–46.0)
Hemoglobin: 11.9 g/dL — ABNORMAL LOW (ref 12.0–15.0)
Lymphocytes Relative: 25 % (ref 12–46)
Lymphs Abs: 1.6 10*3/uL (ref 0.7–4.0)
MCH: 26 pg (ref 26.0–34.0)
MCHC: 32.2 g/dL (ref 30.0–36.0)
MCV: 81 fL (ref 78.0–100.0)
Monocytes Absolute: 0.3 10*3/uL (ref 0.1–1.0)
Monocytes Relative: 5 % (ref 3–12)
Neutro Abs: 4.5 10*3/uL (ref 1.7–7.7)
Neutrophils Relative %: 67 % (ref 43–77)
RBC: 4.57 MIL/uL (ref 3.87–5.11)
WBC: 6.6 10*3/uL (ref 4.0–10.5)

## 2012-12-03 LAB — LIPID PANEL
Cholesterol: 191 mg/dL (ref 0–200)
VLDL: 16 mg/dL (ref 0–40)

## 2012-12-03 LAB — COMPREHENSIVE METABOLIC PANEL
ALT: 25 U/L (ref 0–35)
CO2: 22 mEq/L (ref 19–32)
Calcium: 9.2 mg/dL (ref 8.4–10.5)
Chloride: 105 mEq/L (ref 96–112)
Creat: 0.57 mg/dL (ref 0.50–1.10)
Sodium: 138 mEq/L (ref 135–145)
Total Protein: 6.9 g/dL (ref 6.0–8.3)

## 2012-12-03 LAB — TSH: TSH: 1.592 u[IU]/mL (ref 0.350–4.500)

## 2012-12-03 LAB — POCT GLYCOSYLATED HEMOGLOBIN (HGB A1C): Hemoglobin A1C: 5.7

## 2012-12-03 NOTE — Progress Notes (Signed)
  Subjective:    Patient ID: Brandy Ramsey, female    DOB: 02/28/1971, 42 y.o.   MRN: [id number]  HPI    Review of Systems  Constitutional: Negative.   HENT: Negative.   Eyes: Negative.   Respiratory: Negative.   Cardiovascular: Negative.   Gastrointestinal: Negative.   Genitourinary: Negative.   Musculoskeletal: Negative.   Skin: Negative.   Neurological: Negative.   Hematological: Negative.   Psychiatric/Behavioral: Negative.        Objective:   Physical Exam        Assessment & Plan:

## 2012-12-03 NOTE — Progress Notes (Signed)
Subjective:    Patient ID: Brandy Ramsey, female    DOB: 1970/12/08, 42 y.o.   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  She does not need a pap smear due to no h/o abnml and had a nml one last yr. When she is stressed, her BP goes up Trying breathing technique Trying lifestyle - doing treadmill every other day. Had some time over Christmas and BP came down to normal. Has been checking daily and usually 100s/70s-80s. Past Medical History  Diagnosis Date  . Anemia   . Thyroid enlargement    No past surgical history on file. Current Outpatient Prescriptions on File Prior to Visit  Medication Sig Dispense Refill  . calcium carbonate (OS-CAL) 600 MG TABS Take 600 mg by mouth 2 (two) times daily with a meal.      . ferrous fumarate (HEMOCYTE - 106 MG FE) 325 (106 FE) MG TABS Take 1 tablet by mouth 2 (two) times daily.      . Multiple Vitamin (MULTIVITAMIN) tablet Take 1 tablet by mouth daily.      . hydrochlorothiazide (HYDRODIURIL) 25 MG tablet Take 1 tablet (25 mg total) by mouth daily.  90 tablet  3   No Known Allergies Family History  Problem Relation Age of Onset  . Hypertension Mother   . Diabetes Mother   . Hypertension Father    Pt had gestational DM.   History   Social History  . Marital Status: Married    Spouse Name: N/A    Number of Children: N/A  . Years of Education: N/A   Occupational History  . physician Geriatrician at [employer]   Social History Main Topics  . Smoking status: Never Smoker   . Smokeless tobacco: None  . Alcohol Use: No  . Drug Use: No  . Sexually Active: Yes    Birth Control/ Protection: Condom     Comment: number of sex partners in the last 12 months 1   Other Topics Concern  . None   Social History Narrative   Exercise on treadmill qod     Review of Systems  Constitutional: Positive for fatigue. Negative for fever, chills, diaphoresis, activity change, appetite change and  unexpected weight change.  HENT: Negative for hearing loss, ear pain, nosebleeds, congestion, facial swelling, rhinorrhea, sneezing, drooling, mouth sores, trouble swallowing, neck pain, neck stiffness, dental problem, voice change, postnasal drip, sinus pressure, tinnitus and ear discharge.   Eyes: Negative for photophobia, pain, discharge, redness, itching and visual disturbance.  Respiratory: Negative for apnea, cough, choking, chest tightness, shortness of breath, wheezing and stridor.   Cardiovascular: Negative for chest pain, palpitations and leg swelling.  Gastrointestinal: Negative for nausea, vomiting, abdominal pain, diarrhea, constipation, blood in stool, abdominal distention, anal bleeding and rectal pain.  Genitourinary: Negative for dysuria, urgency, frequency, hematuria, flank pain, decreased urine volume, vaginal bleeding, vaginal discharge, enuresis, difficulty urinating, genital sores, vaginal pain, menstrual problem, pelvic pain and dyspareunia.  Musculoskeletal: Negative for myalgias, back pain, joint swelling, arthralgias and gait problem.  Skin: Negative for color change, pallor, rash and wound.  Neurological: Positive for headaches. Negative for dizziness, tremors, seizures, syncope, facial asymmetry, speech difficulty, weakness, light-headedness and numbness.  Hematological: Negative for adenopathy. Does not bruise/bleed easily.  Psychiatric/Behavioral: Positive for sleep disturbance. Negative for suicidal ideas, hallucinations, behavioral problems, confusion, self-injury, dysphoric mood, decreased concentration and agitation. The patient is not nervous/anxious and is not hyperactive.  Objective:   Physical Exam  Constitutional: She is oriented to person, place, and time. She appears well-developed and well-nourished. No distress.  HENT:  Head: Normocephalic and atraumatic.  Right Ear: Tympanic membrane, external ear and ear canal normal.  Left Ear: Tympanic  membrane, external ear and ear canal normal.  Nose: Nose normal. No mucosal edema or rhinorrhea.  Mouth/Throat: Uvula is midline, oropharynx is clear and moist and mucous membranes are normal. No posterior oropharyngeal erythema.  Eyes: Conjunctivae normal and EOM are normal. Pupils are equal, round, and reactive to light. Right eye exhibits no discharge. Left eye exhibits no discharge. No scleral icterus.  Neck: Normal range of motion. Neck supple. No thyromegaly present.  Cardiovascular: Normal rate, regular rhythm, normal heart sounds and intact distal pulses.   Pulmonary/Chest: Effort normal and breath sounds normal. No respiratory distress.  Abdominal: Soft. Bowel sounds are normal. There is no tenderness.  Genitourinary: No breast swelling, tenderness, discharge or bleeding.  Musculoskeletal: She exhibits no edema.  Lymphadenopathy:    She has no cervical adenopathy.  Neurological: She is alert and oriented to person, place, and time. She has normal reflexes.  Skin: Skin is warm and dry. She is not diaphoretic. No erythema.  Psychiatric: She has a normal mood and affect. Her behavior is normal.          Results for orders placed in visit on 12/03/12  POCT GLYCOSYLATED HEMOGLOBIN (HGB A1C)      Component Value Range   Hemoglobin A1C 5.7    COMPREHENSIVE METABOLIC PANEL      Component Value Range   Sodium 138  135 - 145 mEq/L   Potassium 4.1  3.5 - 5.3 mEq/L   Chloride 105  96 - 112 mEq/L   CO2 22  19 - 32 mEq/L   Glucose, Bld 92  70 - 99 mg/dL   BUN 11  6 - 23 mg/dL   Creat 1.61  0.96 - 0.45 mg/dL   Total Bilirubin 0.5  0.3 - 1.2 mg/dL   Alkaline Phosphatase 66  39 - 117 U/L   AST 17  0 - 37 U/L   ALT 25  0 - 35 U/L   Total Protein 6.9  6.0 - 8.3 g/dL   Albumin 4.3  3.5 - 5.2 g/dL   Calcium 9.2  8.4 - 40.9 mg/dL  LIPID PANEL      Component Value Range   Cholesterol 191  0 - 200 mg/dL   Triglycerides 82  <811 mg/dL   HDL 63  >91 mg/dL   Total CHOL/HDL Ratio 3.0      VLDL 16  0 - 40 mg/dL   LDL Cholesterol 478 (*) 0 - 99 mg/dL  TSH      Component Value Range   TSH 1.592  0.350 - 4.500 uIU/mL  CBC WITH DIFFERENTIAL      Component Value Range   WBC 6.6  4.0 - 10.5 K/uL   RBC 4.57  3.87 - 5.11 MIL/uL   Hemoglobin 11.9 (*) 12.0 - 15.0 g/dL   HCT 29.5  62.1 - 30.8 %   MCV 81.0  78.0 - 100.0 fL   MCH 26.0  26.0 - 34.0 pg   MCHC 32.2  30.0 - 36.0 g/dL   RDW 65.7  84.6 - 96.2 %   Platelets 393  150 - 400 K/uL   Neutrophils Relative 67  43 - 77 %   Neutro Abs 4.5  1.7 - 7.7 K/uL   Lymphocytes  Relative 25  12 - 46 %   Lymphs Abs 1.6  0.7 - 4.0 K/uL   Monocytes Relative 5  3 - 12 %   Monocytes Absolute 0.3  0.1 - 1.0 K/uL   Eosinophils Relative 2  0 - 5 %   Eosinophils Absolute 0.1  0.0 - 0.7 K/uL   Basophils Relative 1  0 - 1 %   Basophils Absolute 0.1  0.0 - 0.1 K/uL   Smear Review Criteria for review not met      Assessment & Plan:   1. Routine general medical examination at a health care facility  POCT glycosylated hemoglobin (Hb A1C), Comprehensive metabolic panel, Lipid panel, TSH, CBC with Differential. Repeat papsmear due 2016.  Cont mvi w/ ca/vit D supp. Immunizations UTD. Baseline mammogram nml at 38 - repeat at 48.  2. H/o gestational DM - a1c on high edge of normal. recheckc in 6-12 mos 3. Elevated blood pressure - resolved w/ lifestyle change. Pt will cont to monitor and let me know if elevated and she decides to try hctz in which case may need bmp in 1-2 wks after starting. 4. Mild anemia - check ferritin

## 2012-12-05 ENCOUNTER — Encounter: Payer: Self-pay | Admitting: Family Medicine

## 2013-08-29 ENCOUNTER — Other Ambulatory Visit: Payer: Self-pay | Admitting: *Deleted

## 2013-08-29 MED ORDER — DIPHENOXYLATE-ATROPINE 2.5-0.025 MG PO TABS: 2.5000 | ORAL_TABLET | Freq: Four times a day (QID) | ORAL | Status: DC | PRN

## 2013-08-29 NOTE — Telephone Encounter (Signed)
rx printed, signed, faxed 

## 2013-09-22 ENCOUNTER — Other Ambulatory Visit: Payer: Self-pay

## 2013-10-15 ENCOUNTER — Ambulatory Visit (INDEPENDENT_AMBULATORY_CARE_PROVIDER_SITE_OTHER): Payer: Managed Care, Other (non HMO) | Admitting: Physician Assistant

## 2013-10-15 VITALS — BP 134/82 | HR 99 | Temp 98.5°F | Resp 18 | Ht 60.5 in | Wt 133.0 lb

## 2013-10-15 DIAGNOSIS — L989 Disorder of the skin and subcutaneous tissue, unspecified: Secondary | ICD-10-CM

## 2013-10-15 DIAGNOSIS — K130 Diseases of lips: Secondary | ICD-10-CM

## 2013-10-15 MED ORDER — TRIAMCINOLONE ACETONIDE 0.1 % MT PSTE: 1.0000 "application " | PASTE | Freq: Two times a day (BID) | OROMUCOSAL | Status: DC

## 2013-10-15 NOTE — Patient Instructions (Signed)
angular cheilitis

## 2013-10-15 NOTE — Progress Notes (Signed)
   Subjective:    Patient ID: Brandy Ramsey, female    DOB: February 24, 1971, 42 y.o.   MRN: [id number]  HPI Pt presents to clinic with sore in the corner of the L side of her lip.  She started to noticed a little area several days ago and then this a, it was larger and more painful.  She uses vaseline on her lips but has not used anything on the lesions.  She does not lick her lips.  She has never had anything like this before.  Review of Systems     Objective:   Physical Exam  Vitals reviewed. Constitutional: She is oriented to person, place, and time. She appears well-developed and well-nourished.  HENT:  Head: Normocephalic and atraumatic.  Right Ear: External ear normal.  Left Ear: External ear normal.  Mouth/Throat:    Pulmonary/Chest: Effort normal.  Neurological: She is alert and oriented to person, place, and time.  Psychiatric: She has a normal mood and affect. Her behavior is normal. Judgment and thought content normal.   Results for orders placed in visit on 10/15/13  POCT SKIN KOH      Result Value Range   Skin KOH, POC Negative          Assessment & Plan:  Skin lesion - Plan: POCT Skin KOH  Angular cheilitis - Pt will try keeping the area moisturized with vaseline and then it that does not help she will use the steroid cream for the inflammation.  Plan: triamcinolone (KENALOG) 0.1 % paste  Benny Lennert [facility] 10/15/2013 12:05 PM

## 2013-12-30 ENCOUNTER — Encounter: Payer: Self-pay | Admitting: Family Medicine

## 2013-12-30 ENCOUNTER — Ambulatory Visit (INDEPENDENT_AMBULATORY_CARE_PROVIDER_SITE_OTHER): Payer: BC Managed Care – PPO | Admitting: Family Medicine

## 2013-12-30 VITALS — BP 120/96 | HR 82 | Temp 98.5°F | Resp 16 | Ht 60.0 in | Wt 133.6 lb

## 2013-12-30 DIAGNOSIS — Z13220 Encounter for screening for lipoid disorders: Secondary | ICD-10-CM

## 2013-12-30 DIAGNOSIS — E010 Iodine-deficiency related diffuse (endemic) goiter: Secondary | ICD-10-CM

## 2013-12-30 DIAGNOSIS — Z1329 Encounter for screening for other suspected endocrine disorder: Secondary | ICD-10-CM

## 2013-12-30 DIAGNOSIS — Z13228 Encounter for screening for other metabolic disorders: Secondary | ICD-10-CM

## 2013-12-30 DIAGNOSIS — E049 Nontoxic goiter, unspecified: Secondary | ICD-10-CM

## 2013-12-30 DIAGNOSIS — Z0000 Encounter for general adult medical examination without abnormal findings: Secondary | ICD-10-CM

## 2013-12-30 DIAGNOSIS — Z130 Encounter for screening for diseases of the blood and blood-forming organs and certain disorders involving the immune mechanism: Secondary | ICD-10-CM

## 2013-12-30 DIAGNOSIS — F411 Generalized anxiety disorder: Secondary | ICD-10-CM

## 2013-12-30 LAB — CBC WITH DIFFERENTIAL/PLATELET
BASOS PCT: 1 % (ref 0–1)
Basophils Absolute: 0.1 10*3/uL (ref 0.0–0.1)
Eosinophils Absolute: 0.1 10*3/uL (ref 0.0–0.7)
Eosinophils Relative: 2 % (ref 0–5)
HEMATOCRIT: 43.4 % (ref 36.0–46.0)
Hemoglobin: 14.2 g/dL (ref 12.0–15.0)
Lymphocytes Relative: 21 % (ref 12–46)
Lymphs Abs: 1.7 10*3/uL (ref 0.7–4.0)
MCH: 26.1 pg (ref 26.0–34.0)
MCHC: 32.7 g/dL (ref 30.0–36.0)
MCV: 79.8 fL (ref 78.0–100.0)
Monocytes Absolute: 0.5 10*3/uL (ref 0.1–1.0)
Monocytes Relative: 6 % (ref 3–12)
Neutro Abs: 5.7 10*3/uL (ref 1.7–7.7)
Neutrophils Relative %: 70 % (ref 43–77)
Platelets: 380 10*3/uL (ref 150–400)
RBC: 5.44 MIL/uL — ABNORMAL HIGH (ref 3.87–5.11)
RDW: 14.5 % (ref 11.5–15.5)
WBC: 8 10*3/uL (ref 4.0–10.5)

## 2013-12-30 LAB — LIPID PANEL
Cholesterol: 201 mg/dL — ABNORMAL HIGH (ref 0–200)
HDL: 64 mg/dL (ref >39)
LDL Cholesterol: 115 mg/dL — ABNORMAL HIGH (ref 0–99)
Total CHOL/HDL Ratio: 3.1 Ratio
Triglycerides: 111 mg/dL (ref <150)
VLDL: 22 mg/dL (ref 0–40)

## 2013-12-30 LAB — T3, FREE: T3, Free: 3 pg/mL (ref 2.3–4.2)

## 2013-12-30 LAB — POCT URINALYSIS DIPSTICK
Bilirubin, UA: NEGATIVE
Blood, UA: NEGATIVE
Glucose, UA: NEGATIVE
Ketones, UA: NEGATIVE
Nitrite, UA: NEGATIVE
Protein, UA: 30
Spec Grav, UA: 1.025
Urobilinogen, UA: 0.2
pH, UA: 6

## 2013-12-30 LAB — COMPLETE METABOLIC PANEL WITH GFR
ALK PHOS: 77 U/L (ref 39–117)
ALT: 43 U/L — ABNORMAL HIGH (ref 0–35)
AST: 26 U/L (ref 0–37)
Albumin: 4.7 g/dL (ref 3.5–5.2)
BUN: 11 mg/dL (ref 6–23)
CO2: 22 mEq/L (ref 19–32)
Calcium: 9.9 mg/dL (ref 8.4–10.5)
Chloride: 103 mEq/L (ref 96–112)
Creat: 0.67 mg/dL (ref 0.50–1.10)
GFR, Est African American: >89 mL/min
GFR, Est Non African American: >89 mL/min
Glucose, Bld: 102 mg/dL — ABNORMAL HIGH (ref 70–99)
POTASSIUM: 4.1 meq/L (ref 3.5–5.3)
Sodium: 136 mEq/L (ref 135–145)
Total Bilirubin: 0.5 mg/dL (ref 0.2–1.2)
Total Protein: 8 g/dL (ref 6.0–8.3)

## 2013-12-30 LAB — TSH: TSH: 3.121 u[IU]/mL (ref 0.350–4.500)

## 2013-12-30 LAB — T4, FREE: Free T4: 1.63 ng/dL (ref 0.80–1.80)

## 2013-12-30 MED ORDER — LORAZEPAM 0.5 MG PO TABS: 0.5000 mg | ORAL_TABLET | Freq: Two times a day (BID) | ORAL | Status: DC | PRN

## 2013-12-30 NOTE — Progress Notes (Signed)
Subjective:    Patient ID: Brandy Ramsey, female    DOB: 1971-04-25, 43 y.o.   MRN: [id number]  HPI  This 43 y.o. Female is here for CPE and to discuss some work-life balance stress that has been increasing for the last 3-4 months. She has developed sleep disturbance and mild-moderate anxiety. 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 She works full time and voices some significant challenges associated with work and home life as a wife and a mother of two. She has little time for recreation or "down time" from work obligations.  HCM: MMG- Current.           PAP- Current(GYN)- negative.           IMM-  Current.           Vision- Every 1-2 years.  Patient Active Problem List   Diagnosis Date Noted  . Fe deficiency anemia 12/08/2011  . Thyroid enlargement 12/08/2011   PMHx, Soc Hx and Fam Hx reviewed. MEDICATIONS reconciled.   Review of Systems  Constitutional: Negative.   HENT: Negative.   Eyes: Negative.   Respiratory: Positive for chest tightness. Negative for cough, choking and shortness of breath.   Cardiovascular: Positive for palpitations. Negative for chest pain and leg swelling.  Gastrointestinal: Negative.   Endocrine: Negative.   Genitourinary: Negative.   Musculoskeletal: Negative.   Skin: Negative.   Allergic/Immunologic: Negative.   Neurological: Negative.   Hematological: Negative.   Psychiatric/Behavioral: Positive for sleep disturbance and decreased concentration. Negative for behavioral problems, self-injury, dysphoric mood and agitation. The patient is nervous/anxious.        Mild panic attack- like symptoms interfere w/ ability to function at optimum level.       Objective:   Physical Exam  Nursing note and vitals reviewed. Constitutional: She is oriented to person, place, and time. Vital signs are normal. She appears well-developed and well-nourished.  HENT:  Head: Normocephalic and atraumatic.    Right Ear: Hearing, tympanic membrane, external ear and ear canal normal.  Left Ear: Hearing, tympanic membrane, external ear and ear canal normal.  Nose: Nose normal. No nasal deformity or septal deviation.  Mouth/Throat: Uvula is midline, oropharynx is clear and moist and mucous membranes are normal. No oral lesions. Normal dentition. No dental caries.  Eyes: Conjunctivae, EOM and lids are normal. Pupils are equal, round, and reactive to light. No scleral icterus.  Fundoscopic exam:      The right eye shows no arteriolar narrowing and no AV nicking. The right eye shows red reflex.       The left eye shows no arteriolar narrowing and no papilledema. The left eye shows red reflex.  Neck: Trachea normal, normal range of motion and full passive range of motion without pain. Neck supple. No JVD present. No spinous process tenderness and no muscular tenderness present. Thyromegaly present. No mass present.  Cardiovascular: Normal rate, regular rhythm, S1 normal, S2 normal, normal heart sounds, intact distal pulses and normal pulses.   No extrasystoles are present. PMI is not displaced.  Exam reveals no gallop and no friction rub.   No murmur heard. Pulmonary/Chest: Effort normal and breath sounds normal. No respiratory distress.  Abdominal: Soft. Normal appearance and bowel sounds are normal. She exhibits no distension, no pulsatile midline mass and no mass. There is no hepatosplenomegaly. There is no tenderness. There is no rigidity, no guarding and no CVA tenderness.  Genitourinary:  Exam deferred.  Musculoskeletal: Normal range of motion. She exhibits no edema and no tenderness.  Lymphadenopathy:       Head (right side): No submental, no submandibular, no tonsillar, no posterior auricular and no occipital adenopathy present.       Head (left side): No submental, no submandibular, no tonsillar, no posterior auricular and no occipital adenopathy present.    She has no cervical adenopathy.        Right: No supraclavicular adenopathy present.       Left: No supraclavicular adenopathy present.  Neurological: She is alert and oriented to person, place, and time. She has normal strength. She displays no atrophy and no tremor. No cranial nerve deficit or sensory deficit. She exhibits normal muscle tone. Coordination and gait normal.  Reflex Scores:      Tricep reflexes are 1+ on the right side and 1+ on the left side.      Bicep reflexes are 2+ on the right side and 2+ on the left side.      Brachioradialis reflexes are 2+ on the right side and 2+ on the left side.      Patellar reflexes are 2+ on the right side and 2+ on the left side. Skin: Skin is warm, dry and intact. No ecchymosis, no lesion and no rash noted. She is not diaphoretic. No cyanosis or erythema. Nails show no clubbing.  Psychiatric: Her speech is normal and behavior is normal. Judgment and thought content normal. Her mood appears anxious. Her affect is labile. Her affect is not angry, not blunt and not inappropriate. Cognition and memory are normal. She does not exhibit a depressed mood.    Results for orders placed in visit on 12/30/13  POCT URINALYSIS DIPSTICK      Result Value Ref Range   Color, UA yellow     Clarity, UA clear     Glucose, UA neg     Bilirubin, UA neg     Ketones, UA neg     Spec Grav, UA 1.025     Blood, UA neg     pH, UA 6.0     Protein, UA 30     Urobilinogen, UA 0.2     Nitrite, UA neg     Leukocytes, UA small (1+)        Assessment & Plan:  Routine general medical examination at a health care facility - Plan: CBC with Differential, Lipid panel, TSH, T3, Free, T4, free, COMPLETE METABOLIC PANEL WITH GFR, POCT urinalysis dipstick  Thyromegaly - Plan: TSH, T3, Free, T4, free  Anxiety state, unspecified- 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 I had a lenghthy discussion with pt about the fact that she is at a  crossroads in her career; she will give consideration to transition such that she can have a better work-life balance.  Screening for other and unspecified deficiency anemia  Need for lipid screening - Plan: Lipid panel  Meds ordered this encounter  Medications  . LORazepam (ATIVAN) 0.5 MG tablet    Sig: Take 1 tablet (0.5 mg total) by mouth 2 (two) times daily as needed for anxiety.    Dispense:  30 tablet    Refill:  1   (Letter completed for pick-up from [facility] on 01/03/14).

## 2013-12-30 NOTE — Patient Instructions (Signed)
Keeping You Healthy  Get These Tests 1. Blood Pressure- Have your blood pressure checked once a year by your health care provider.  Normal blood pressure is 120/80. 2. Weight- Have your body mass index (BMI) calculated to screen for obesity.  BMI is measure of body fat based on height and weight.  You can also calculate your own BMI at GravelBags.it. 3. Cholesterol- Have your cholesterol checked every 5 years starting at age 45 then yearly starting at age 56. 27. Chlamydia, HIV, and other sexually transmitted diseases- Get screened every year until age 47, then within three months of each new sexual provider. 5. Pap Smear- Every 1-3 years; discuss with your health care provider. 6. Mammogram- Every year starting at age 28  Take these medicines  Calcium with Vitamin D-Your body needs 1200 mg of Calcium each day and [phone] IU of Vitamin D daily.  Your body can only absorb 500 mg of Calcium at a time so Calcium must be taken in 2 or 3 divided doses throughout the day.  Multivitamin with folic acid- Once daily if it is possible for you to become pregnant.  Get these Immunizations  Gardasil-Series of three doses; prevents HPV related illness such as genital warts and cervical cancer.  Menactra-Single dose; prevents meningitis.  Tetanus shot- Every 10 years.  Flu shot-Every year.  Take these steps 1. Do not smoke-Your healthcare provider can help you quit.  For tips on how to quit go to www.smokefree.gov or call 1-800 QUITNOW. 2. Be physically active- Exercise 5 days a week for at least 30 minutes.  If you are not already physically active, start slow and gradually work up to 30 minutes of moderate physical activity.  Examples of moderate activity include walking briskly, dancing, swimming, bicycling, etc. 3. Breast Cancer- A self breast exam every month is important for early detection of breast cancer.  For more information and instruction on self breast exams, ask your  healthcare provider or https://www.patel.info/. 4. Eat a healthy diet- Eat a variety of healthy foods such as fruits, vegetables, whole grains, low fat milk, low fat cheeses, yogurt, lean meats, poultry and fish, beans, nuts, tofu, etc.  For more information go to www. Thenutritionsource.org 5. Drink alcohol in moderation- Limit alcohol intake to one drink or less per day. Never drink and drive. 6. Depression- Your emotional health is as important as your physical health.  If you're feeling down or losing interest in things you normally enjoy please talk to your healthcare provider about being screened for depression. 7. Dental visit- Brush and floss your teeth twice daily; visit your dentist twice a year. 8. Eye doctor- Get an eye exam at least every 2 years. 9. Helmet use- Always wear a helmet when riding a bicycle, motorcycle, rollerblading or skateboarding. 50. Safe sex- If you may be exposed to sexually transmitted infections, use a condom. 11. Seat belts- Seat belts can save your live; always wear one. 12. Smoke/Carbon Monoxide detectors- These detectors need to be installed on the appropriate level of your home. Replace batteries at least once a year. 13. Skin cancer- When out in the sun please cover up and use sunscreen 15 SPF or higher. 14. Violence- If anyone is threatening or hurting you, please tell your healthcare provider.   For Insomnia- Get MELATONIN 3 mg over-the-counter; take 1 tablet at bedtime for restful sleep.  If you have time to do reading for leisure, [facility] provides a lot of spiritual guidance. There is counseling for emplyees  through the [facility].

## 2014-01-02 ENCOUNTER — Telehealth: Payer: Self-pay

## 2014-01-02 NOTE — Telephone Encounter (Signed)
Patient states that Dr. Leward Quan was to write her a letter for medical leave of absence for work. Patient states this is not in regards to FMLA ppw it is just a letter.  [phone]

## 2014-01-02 NOTE — Telephone Encounter (Signed)
Do you recall this paperwork?

## 2014-01-03 ENCOUNTER — Encounter: Payer: Self-pay | Admitting: Family Medicine

## 2014-01-03 DIAGNOSIS — E663 Overweight: Secondary | ICD-10-CM | POA: Insufficient documentation

## 2014-01-03 DIAGNOSIS — F411 Generalized anxiety disorder: Secondary | ICD-10-CM | POA: Insufficient documentation

## 2014-01-03 NOTE — Telephone Encounter (Addendum)
Pt and I discussed a letter for LOA; I will contact her when letter is ready. I should have it by tomorrow afternoon.

## 2014-01-04 ENCOUNTER — Telehealth: Payer: Self-pay | Admitting: *Deleted

## 2014-01-04 NOTE — Telephone Encounter (Signed)
mychart message

## 2014-01-11 ENCOUNTER — Telehealth: Payer: Self-pay

## 2014-01-11 NOTE — Telephone Encounter (Addendum)
FMLA is currently with Dr.Mcpherson. Will call pt once they are returned to the disabilities desk. AJR

## 2014-01-16 DIAGNOSIS — Z0271 Encounter for disability determination: Secondary | ICD-10-CM

## 2014-01-26 ENCOUNTER — Encounter: Payer: Self-pay | Admitting: Family Medicine

## 2014-03-16 ENCOUNTER — Telehealth: Payer: Self-pay | Admitting: Radiology

## 2014-03-16 NOTE — Telephone Encounter (Signed)
Dr. Leward Quan:  Patient needs a letter for [facility] and she needs to explain to you what she needs. Her best number is [phone]

## 2014-03-16 NOTE — Telephone Encounter (Signed)
Spoke to patient to advise. She states she only wants to discuss with Dr Leward Quan, the note from February needs addended, and she refused to give me additional information

## 2014-03-17 NOTE — Telephone Encounter (Signed)
I discussed current situation w/ pt and will get a letter to her next week. She asks that letter be mailed to her home.

## 2014-03-19 ENCOUNTER — Encounter: Payer: Self-pay | Admitting: Family Medicine

## 2014-03-19 NOTE — Telephone Encounter (Signed)
Letter written and mailed to pt's home on 03/19/2014.

## 2014-04-28 ENCOUNTER — Ambulatory Visit: Payer: BC Managed Care – PPO | Admitting: Family Medicine

## 2014-08-25 ENCOUNTER — Encounter: Payer: Self-pay | Admitting: Family Medicine

## 2014-08-25 ENCOUNTER — Ambulatory Visit (INDEPENDENT_AMBULATORY_CARE_PROVIDER_SITE_OTHER): Payer: BC Managed Care – PPO | Admitting: Family Medicine

## 2014-08-25 VITALS — BP 136/92 | HR 88 | Temp 98.5°F | Resp 16 | Ht 60.5 in | Wt 131.0 lb

## 2014-08-25 DIAGNOSIS — F5102 Adjustment insomnia: Secondary | ICD-10-CM

## 2014-08-25 DIAGNOSIS — F418 Other specified anxiety disorders: Secondary | ICD-10-CM

## 2014-08-25 MED ORDER — CITALOPRAM HYDROBROMIDE 20 MG PO TABS: 20.0000 mg | ORAL_TABLET | Freq: Every day | ORAL | Status: DC

## 2014-08-25 MED ORDER — LORAZEPAM 0.5 MG PO TABS: 0.5000 mg | ORAL_TABLET | Freq: Two times a day (BID) | ORAL | Status: DC | PRN

## 2014-08-25 NOTE — Patient Instructions (Signed)
Celexa 20 mg:   1/2 tablet every morning for the first 7- 10 days then increase to 1 tablet every morning. Send me a note through MyChart about how this is going for you.

## 2014-08-29 NOTE — Progress Notes (Signed)
S:  This 43 y.o. Female is here to follow-up re: anxiety and work-related stress. 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 Work schedule was changed to better suit her personal life. Challenges at work continue to cause anxiety and now she has developed a sleep disturbance. This is leading to next day fatigue and decreased concentration. Lorazepam helps w/ relaxation but pt is taking medication almost daily. She thinks she should try a daily anxiolytic to help stabilize her mood and help improve sleep hygiene. She has tried various OTC sleep aids w/o benefit.  Patient Active Problem List   Diagnosis Date Noted  . Overweight (BMI 25.0-29.9) 01/03/2014  . Anxiety state, unspecified 01/03/2014  . Fe deficiency anemia 12/08/2011  . Thyroid enlargement 12/08/2011    Prior to Admission medications   Medication Sig Start Date End Date Taking? Authorizing Provider  calcium carbonate (OS-CAL) 600 MG TABS Take 600 mg by mouth 2 (two) times daily with a meal.   Yes Historical Provider, MD  ferrous fumarate (HEMOCYTE - 106 MG FE) 325 (106 FE) MG TABS Take 1 tablet by mouth 2 (two) times daily.   Yes Hayden Rasmussen, MD  LORazepam (ATIVAN) 0.5 MG tablet Take 1 tablet (0.5 mg total) by mouth 2 (two) times daily as needed for anxiety.   Yes Barton Fanny, MD  Multiple Vitamin (MULTIVITAMIN) tablet Take 1 tablet by mouth daily.   Yes Historical Provider, MD  triamcinolone (KENALOG) 0.1 % paste Use as directed 1 application in the mouth or throat 2 (two) times daily. 10/15/13   Mancel Bale, PA-C    History   Social History  . Marital Status: Married    Spouse Name: N/A    Number of Children: N/A  . Years of Education: N/A   Occupational History  . physician    Social History Main Topics  . Smoking status: Never Smoker   . Smokeless tobacco: Not on file  . Alcohol Use: No  . Drug Use: No  . Sexual Activity: Yes    Birth Control/ Protection: Condom     Comment: number of sex  partners in the last 33 months 1   Other Topics Concern  . Not on file   Social History Narrative   Exercise on treadmill qod. Married. Education: Freight forwarder. Exercise: Yes.    Family History  Problem Relation Age of Onset  . Hypertension Mother   . Diabetes Mother   . Hypertension Father     O: Filed Vitals:   08/25/14 0940  BP: 136/92  Pulse: 88  Temp: 98.5 F (36.9 C)  Resp: 16    GEN: in NAD: WN,WD. HENT: [place]/AT. EOMI w/ clear conj/sclerae. Otherwise unremarkable.  COR: RRR.  LUNGS: Normal resp rate and effort. SKIN: W&D; intact w/o erythema or diaphoresis. NEURO: A&O x 3; Cns intact. Nonfocal. PSYCH: Pleasant and calm demeanor w/ appropriate affect though slightly anxious. Attentive w/ good eye contact. Speech- articulate and judgement sound.  PHQ-9 score= 5.  A/P: Anxiety associated with depression- Trial Celexa 20 mg 1/2 tablet every morning x 7-10 days then increase to 1 tablet daily if warranted.  Insomnia due to stress- Continue to take Lorazepam prn for sleep. Other strategies for restful sleep discussed.  FACE-to-FACE time spent with pt = 30+ minutes.  Meds ordered this encounter  Medications  . LORazepam (ATIVAN) 0.5 MG tablet    Sig: Take 1 tablet (0.5 mg total) by mouth 2 (two) times daily as needed for anxiety.  Dispense:  30 tablet    Refill:  1  . citalopram (CELEXA) 20 MG tablet    Sig: Take 1 tablet (20 mg total) by mouth daily.    Dispense:  30 tablet    Refill:  5

## 2014-09-12 ENCOUNTER — Other Ambulatory Visit: Payer: Self-pay | Admitting: Family Medicine

## 2014-09-12 MED ORDER — VENLAFAXINE HCL 37.5 MG PO TABS: 37.5000 mg | ORAL_TABLET | Freq: Two times a day (BID) | ORAL | Status: DC

## 2015-01-31 ENCOUNTER — Encounter: Payer: BC Managed Care – PPO | Admitting: Family Medicine

## 2015-03-09 ENCOUNTER — Encounter: Payer: Self-pay | Admitting: Family Medicine

## 2015-03-09 ENCOUNTER — Ambulatory Visit (INDEPENDENT_AMBULATORY_CARE_PROVIDER_SITE_OTHER): Payer: BLUE CROSS/BLUE SHIELD | Admitting: Family Medicine

## 2015-03-09 VITALS — BP 138/83 | HR 97 | Temp 98.4°F | Resp 16 | Ht 60.0 in | Wt 131.4 lb

## 2015-03-09 DIAGNOSIS — Z130 Encounter for screening for diseases of the blood and blood-forming organs and certain disorders involving the immune mechanism: Secondary | ICD-10-CM | POA: Diagnosis not present

## 2015-03-09 DIAGNOSIS — Z131 Encounter for screening for diabetes mellitus: Secondary | ICD-10-CM | POA: Diagnosis not present

## 2015-03-09 DIAGNOSIS — Z13220 Encounter for screening for lipoid disorders: Secondary | ICD-10-CM | POA: Diagnosis not present

## 2015-03-09 DIAGNOSIS — Z0000 Encounter for general adult medical examination without abnormal findings: Secondary | ICD-10-CM

## 2015-03-09 DIAGNOSIS — Z01419 Encounter for gynecological examination (general) (routine) without abnormal findings: Secondary | ICD-10-CM

## 2015-03-09 MED ORDER — LORAZEPAM 0.5 MG PO TABS: 0.5000 mg | ORAL_TABLET | Freq: Two times a day (BID) | ORAL | Status: DC | PRN

## 2015-03-09 NOTE — Patient Instructions (Signed)
Keeping You Healthy  Get These Tests 1. Blood Pressure- Have your blood pressure checked once a year by your health care provider.  Normal blood pressure is 120/80. 2. Weight- Have your body mass index (BMI) calculated to screen for obesity.  BMI is measure of body fat based on height and weight.  You can also calculate your own BMI at [employer]velBags.it. 3. Cholesterol- Have your cholesterol checked every 5 years starting at age 46 then yearly starting at age 46. 56. Chlamydia, HIV, and other sexually transmitted diseases- Get screened every year until age 42, then within three months of each new sexual provider. 5. Pap Smear- Every 1-5 years; discuss with your health care provider. 6. Mammogram- Every 1-2 years starting at age 13--50  Take these medicines  Calcium with Vitamin D-Your body needs 1200 mg of Calcium each day and [phone] IU of Vitamin D daily.  Your body can only absorb 500 mg of Calcium at a time so Calcium must be taken in 2 or 3 divided doses throughout the day.  Multivitamin with folic acid- Once daily if it is possible for you to become pregnant.  Get these Immunizations  Gardasil-Series of three doses; prevents HPV related illness such as genital warts and cervical cancer.  Menactra-Single dose; prevents meningitis.  Tetanus shot- Every 10 years.  Flu shot-Every year.  Take these steps 1. Do not smoke-Your healthcare provider can help you quit.  For tips on how to quit go to www.smokefree.gov or call 1-800 QUITNOW. 2. Be physically active- Exercise 5 days a week for at least 30 minutes.  If you are not already physically active, start slow and gradually work up to 30 minutes of moderate physical activity.  Examples of moderate activity include walking briskly, dancing, swimming, bicycling, etc. 3. Breast Cancer- A self breast exam every month is important for early detection of breast cancer.  For more information and instruction on self breast exams, ask your  healthcare provider or https://www.patel.info/. 4. Eat a healthy diet- Eat a variety of healthy foods such as fruits, vegetables, whole grains, low fat milk, low fat cheeses, yogurt, lean meats, poultry and fish, beans, nuts, tofu, etc.  For more information go to www. Thenutritionsource.org 5. Drink alcohol in moderation- Limit alcohol intake to one drink or less per day. Never drink and drive. 6. Depression- Your emotional health is as important as your physical health.  If you're feeling down or losing interest in things you normally enjoy please talk to your healthcare provider about being screened for depression. 7. Dental visit- Brush and floss your teeth twice daily; visit your dentist twice a year. 8. Eye doctor- Get an eye exam at least every 2 years. 9. Helmet use- Always wear a helmet when riding a bicycle, motorcycle, rollerblading or skateboarding. 72. Safe sex- If you may be exposed to sexually transmitted infections, use a condom. 11. Seat belts- Seat belts can save your live; always wear one. 12. Smoke/Carbon Monoxide detectors- These detectors need to be installed on the appropriate level of your home. Replace batteries at least once a year. 13. Skin cancer- When out in the sun please cover up and use sunscreen 15 SPF or higher. 14. Violence- If anyone is threatening or hurting you, please tell your healthcare provider.

## 2015-03-09 NOTE — Progress Notes (Signed)
Subjective:    Patient ID: Brandy Ramsey, female    DOB: Apr 04, 1971, 44 y.o.   MRN: [id number]  HPI  This 44 y.o. Female is here for CPE (no PAP). She feels well; she does have mild occasional situational anxiety for which she takes lorazepam prn.   HCM: PAP- 2013 (negative).           MMG- Pt declines at this time.           IMM- Current.           Vision- Current; has professional eye care w/ specialist.           Dental- Current.  Patient Active Problem List   Diagnosis Date Noted  . Overweight (BMI 25.0-29.9) 01/03/2014  . Anxiety state, unspecified 01/03/2014  . Fe deficiency anemia 12/08/2011  . Thyroid enlargement 12/08/2011    Prior to Admission medications   Medication Sig Start Date End Date Taking? Authorizing Provider  calcium carbonate (OS-CAL) 600 MG TABS Take 600 mg by mouth 2 (two) times daily with a meal.   Yes Historical Provider, MD  ferrous fumarate (HEMOCYTE - 106 MG FE) 325 (106 FE) MG TABS Take 1 tablet by mouth 2 (two) times daily.   Yes Hayden Rasmussen, MD  LORazepam (ATIVAN) 0.5 MG tablet Take 1 tablet (0.5 mg total) by mouth 2 (two) times daily as needed for anxiety.   Yes Barton Fanny, MD  Multiple Vitamin (MULTIVITAMIN) tablet Take 1 tablet by mouth daily.   Yes Historical Provider, MD    History reviewed. No pertinent past surgical history.  History   Social History  . Marital Status: Married    Spouse Name: N/A  . Number of Children: N/A  . Years of Education: N/A   Occupational History  . physician    Social History Main Topics  . Smoking status: Never Smoker   . Smokeless tobacco: Not on file  . Alcohol Use: No  . Drug Use: No  . Sexual Activity: Yes    Birth Control/ Protection: Condom     Comment: number of sex partners in the last 69 months 1   Other Topics Concern  . Not on file   Social History Narrative   Exercise on treadmill qod. Married. Education: Freight forwarder. Exercise: Yes.    Family History    Problem Relation Age of Onset  . Hypertension Mother   . Diabetes Mother   . Hypertension Father     Review of Systems  Constitutional: Negative.   HENT: Negative.   Eyes: Negative.   Respiratory: Negative.   Cardiovascular: Negative.   Gastrointestinal: Negative.   Endocrine: Negative.   Genitourinary: Negative.   Musculoskeletal: Negative.   Skin: Negative.   Allergic/Immunologic: Negative.   Neurological: Negative.   Hematological: Negative.   Psychiatric/Behavioral: Negative.       Objective:   Physical Exam  Constitutional: She is oriented to person, place, and time. Vital signs are normal. She appears well-developed and well-nourished. No distress.  HENT:  Head: Normocephalic and atraumatic.  Right Ear: Hearing, tympanic membrane, external ear and ear canal normal.  Left Ear: Hearing, tympanic membrane, external ear and ear canal normal.  Nose: Nose normal. No nasal deformity or septal deviation.  Mouth/Throat: Uvula is midline, oropharynx is clear and moist and mucous membranes are normal. No oral lesions. Normal dentition. No dental caries.  Eyes: Conjunctivae, EOM and lids are normal. Pupils are equal, round, and reactive to light. No scleral  icterus.  Wears corrective lenses.  Neck: Trachea normal, normal range of motion, full passive range of motion without pain and phonation normal. Neck supple. No spinous process tenderness and no muscular tenderness present. No thyroid mass and no thyromegaly present.  Cardiovascular: Normal rate, regular rhythm, S1 normal, S2 normal, normal heart sounds, intact distal pulses and normal pulses.   No extrasystoles are present. PMI is not displaced.  Exam reveals no gallop and no friction rub.   No murmur heard. Pulmonary/Chest: Effort normal and breath sounds normal. No respiratory distress. She has no decreased breath sounds. She has no wheezes. She has no rhonchi. Right breast exhibits no inverted nipple, no mass, no nipple  discharge, no skin change and no tenderness. Left breast exhibits no inverted nipple, no mass, no nipple discharge, no skin change and no tenderness. Breasts are symmetrical.  Abdominal: Soft. Normal appearance, normal aorta and bowel sounds are normal. She exhibits no distension and no mass. There is no hepatosplenomegaly. There is no tenderness. There is no guarding and no CVA tenderness.  Genitourinary:  Deferred.  Musculoskeletal:       Cervical back: Normal.       Thoracic back: Normal.       Lumbar back: Normal.  Remainder of exam unremarkable.  Lymphadenopathy:       Head (right side): No submental, no submandibular, no tonsillar, no preauricular, no posterior auricular and no occipital adenopathy present.       Head (left side): No submental, no submandibular, no tonsillar, no preauricular, no posterior auricular and no occipital adenopathy present.    She has no cervical adenopathy.    She has no axillary adenopathy.       Right: No inguinal and no supraclavicular adenopathy present.       Left: No inguinal and no supraclavicular adenopathy present.  Neurological: She is alert and oriented to person, place, and time. She has normal strength and normal reflexes. She displays no atrophy. No cranial nerve deficit or sensory deficit. She exhibits normal muscle tone. She displays a negative Romberg sign. Coordination and gait normal.  Skin: Skin is warm, dry and intact. No ecchymosis, no lesion and no rash noted. She is not diaphoretic. No cyanosis or erythema. Nails show no clubbing.  Psychiatric: She has a normal mood and affect. Her speech is normal and behavior is normal. Judgment and thought content normal. Cognition and memory are normal.  Nursing note and vitals reviewed.      Assessment & Plan:  Well woman exam - Plan: COMPLETE METABOLIC PANEL WITH GFR, TSH  Screening for deficiency anemia - Plan: CBC with Differential/Platelets  Screening for diabetes mellitus - Plan:  Hemoglobin A1c  Screening for lipid disorders - Plan: Lipid panel  Labs are ordered as "Future"; pt not fasting today but will return within next 1-2 weeks for labs.  Meds ordered this encounter  Medications  . LORazepam (ATIVAN) 0.5 MG tablet    Sig: Take 1 tablet (0.5 mg total) by mouth 2 (two) times daily as needed for anxiety.    Dispense:  30 tablet    Refill:  2

## 2015-03-15 ENCOUNTER — Other Ambulatory Visit (INDEPENDENT_AMBULATORY_CARE_PROVIDER_SITE_OTHER): Payer: BLUE CROSS/BLUE SHIELD | Admitting: Family Medicine

## 2015-03-15 DIAGNOSIS — Z01419 Encounter for gynecological examination (general) (routine) without abnormal findings: Secondary | ICD-10-CM

## 2015-03-15 DIAGNOSIS — Z13220 Encounter for screening for lipoid disorders: Secondary | ICD-10-CM

## 2015-03-15 DIAGNOSIS — Z130 Encounter for screening for diseases of the blood and blood-forming organs and certain disorders involving the immune mechanism: Secondary | ICD-10-CM

## 2015-03-15 DIAGNOSIS — Z131 Encounter for screening for diabetes mellitus: Secondary | ICD-10-CM | POA: Diagnosis not present

## 2015-03-15 LAB — LIPID PANEL
Cholesterol: 166 mg/dL (ref 0–200)
HDL: 56 mg/dL (ref >=46)
LDL CALC: 93 mg/dL (ref 0–99)
Total CHOL/HDL Ratio: 3 Ratio
Triglycerides: 87 mg/dL (ref <150)
VLDL: 17 mg/dL (ref 0–40)

## 2015-03-15 LAB — CBC WITH DIFFERENTIAL/PLATELET
Basophils Absolute: 0.1 10*3/uL (ref 0.0–0.1)
Basophils Relative: 1 % (ref 0–1)
EOS PCT: 2 % (ref 0–5)
Eosinophils Absolute: 0.1 10*3/uL (ref 0.0–0.7)
HCT: 36.5 % (ref 36.0–46.0)
HEMOGLOBIN: 11.9 g/dL — AB (ref 12.0–15.0)
LYMPHS ABS: 1.8 10*3/uL (ref 0.7–4.0)
Lymphocytes Relative: 27 % (ref 12–46)
MCH: 26.3 pg (ref 26.0–34.0)
MCHC: 32.6 g/dL (ref 30.0–36.0)
MCV: 80.8 fL (ref 78.0–100.0)
MPV: 9.4 fL (ref 8.6–12.4)
Monocytes Absolute: 0.5 10*3/uL (ref 0.1–1.0)
Monocytes Relative: 7 % (ref 3–12)
NEUTROS PCT: 63 % (ref 43–77)
Neutro Abs: 4.1 10*3/uL (ref 1.7–7.7)
Platelets: 371 10*3/uL (ref 150–400)
RBC: 4.52 MIL/uL (ref 3.87–5.11)
RDW: 14.6 % (ref 11.5–15.5)
WBC: 6.5 10*3/uL (ref 4.0–10.5)

## 2015-03-15 LAB — COMPLETE METABOLIC PANEL WITH GFR
ALK PHOS: 62 U/L (ref 39–117)
ALT: 24 U/L (ref 0–35)
AST: 16 U/L (ref 0–37)
Albumin: 4.2 g/dL (ref 3.5–5.2)
BILIRUBIN TOTAL: 0.5 mg/dL (ref 0.2–1.2)
BUN: 10 mg/dL (ref 6–23)
CO2: 24 mEq/L (ref 19–32)
Calcium: 8.9 mg/dL (ref 8.4–10.5)
Chloride: 103 mEq/L (ref 96–112)
Creat: 0.67 mg/dL (ref 0.50–1.10)
GFR, Est African American: >89 mL/min
GFR, Est Non African American: >89 mL/min
Glucose, Bld: 87 mg/dL (ref 70–99)
POTASSIUM: 4.3 meq/L (ref 3.5–5.3)
SODIUM: 139 meq/L (ref 135–145)
TOTAL PROTEIN: 6.6 g/dL (ref 6.0–8.3)

## 2015-03-15 LAB — HEMOGLOBIN A1C
Hgb A1c MFr Bld: 6.2 % — ABNORMAL HIGH (ref <5.7)
Mean Plasma Glucose: 131 mg/dL — ABNORMAL HIGH (ref <117)

## 2015-03-15 LAB — TSH: TSH: 1.911 u[IU]/mL (ref 0.350–4.500)

## 2015-04-03 ENCOUNTER — Other Ambulatory Visit: Payer: Self-pay | Admitting: Family Medicine

## 2015-04-03 MED ORDER — BUSPIRONE HCL 15 MG PO TABS: ORAL_TABLET | ORAL | Status: DC

## 2015-07-31 ENCOUNTER — Ambulatory Visit (INDEPENDENT_AMBULATORY_CARE_PROVIDER_SITE_OTHER): Payer: BLUE CROSS/BLUE SHIELD | Admitting: *Deleted

## 2015-07-31 DIAGNOSIS — Z23 Encounter for immunization: Secondary | ICD-10-CM

## 2016-03-13 ENCOUNTER — Encounter: Payer: Self-pay | Admitting: Family Medicine

## 2016-03-13 ENCOUNTER — Ambulatory Visit (INDEPENDENT_AMBULATORY_CARE_PROVIDER_SITE_OTHER): Payer: BLUE CROSS/BLUE SHIELD | Admitting: Family Medicine

## 2016-03-13 VITALS — BP 153/90 | HR 94 | Temp 98.7°F | Resp 16 | Ht 61.0 in | Wt 128.0 lb

## 2016-03-13 DIAGNOSIS — Z136 Encounter for screening for cardiovascular disorders: Secondary | ICD-10-CM

## 2016-03-13 DIAGNOSIS — Z113 Encounter for screening for infections with a predominantly sexual mode of transmission: Secondary | ICD-10-CM

## 2016-03-13 DIAGNOSIS — Z1321 Encounter for screening for nutritional disorder: Secondary | ICD-10-CM | POA: Diagnosis not present

## 2016-03-13 DIAGNOSIS — Z0000 Encounter for general adult medical examination without abnormal findings: Secondary | ICD-10-CM | POA: Diagnosis not present

## 2016-03-13 DIAGNOSIS — F411 Generalized anxiety disorder: Secondary | ICD-10-CM | POA: Diagnosis not present

## 2016-03-13 DIAGNOSIS — Z1383 Encounter for screening for respiratory disorder NEC: Secondary | ICD-10-CM | POA: Diagnosis not present

## 2016-03-13 DIAGNOSIS — Z1329 Encounter for screening for other suspected endocrine disorder: Secondary | ICD-10-CM | POA: Diagnosis not present

## 2016-03-13 DIAGNOSIS — R7302 Impaired glucose tolerance (oral): Secondary | ICD-10-CM | POA: Diagnosis not present

## 2016-03-13 DIAGNOSIS — D509 Iron deficiency anemia, unspecified: Secondary | ICD-10-CM | POA: Diagnosis not present

## 2016-03-13 DIAGNOSIS — Z1389 Encounter for screening for other disorder: Secondary | ICD-10-CM | POA: Diagnosis not present

## 2016-03-13 LAB — POCT URINALYSIS DIP (MANUAL ENTRY)
BILIRUBIN UA: NEGATIVE
Bilirubin, UA: NEGATIVE
Glucose, UA: NEGATIVE
Nitrite, UA: NEGATIVE
PH UA: 7
PROTEIN UA: NEGATIVE
RBC UA: NEGATIVE
SPEC GRAV UA: 1.015
Urobilinogen, UA: 0.2

## 2016-03-13 MED ORDER — LORAZEPAM 0.5 MG PO TABS: 0.5000 mg | ORAL_TABLET | Freq: Two times a day (BID) | ORAL | Status: DC | PRN

## 2016-03-13 NOTE — Patient Instructions (Addendum)
   IF you received an x-ray today, you will receive an invoice from [facility]. Please contact [facility] at [phone] with questions or concerns regarding your invoice.   IF you received labwork today, you will receive an invoice from [facility]. Please contact [facility] at [phone] with questions or concerns regarding your invoice.   Our billing staff will not be able to assist you with questions regarding bills from these companies.  You will be contacted with the lab results as soon as they are available. The fastest way to get your results is to activate your My Chart account. Instructions are located on the last page of this paperwork. If you have not heard from us regarding the results in 2 weeks, please contact this office.     Health Maintenance, Female Adopting a healthy lifestyle and getting preventive care can go a long way to promote health and wellness. Talk with your health care provider about what schedule of regular examinations is right for you. This is a good chance for you to check in with your provider about disease prevention and staying healthy. In between checkups, there are plenty of things you can do on your own. Experts have done a lot of research about which lifestyle changes and preventive measures are most likely to keep you healthy. Ask your health care provider for more information. WEIGHT AND DIET  Eat a healthy diet  Be sure to include plenty of vegetables, fruits, low-fat dairy products, and lean protein.  Do not eat a lot of foods high in solid fats, added sugars, or salt.  Get regular exercise. This is one of the most important things you can do for your health.  Most adults should exercise for at least 150 minutes each week. The exercise should increase your heart rate and make you sweat (moderate-intensity exercise).  Most adults should also do strengthening exercises at least twice a week. This  is in addition to the moderate-intensity exercise.  Maintain a healthy weight  Body mass index (BMI) is a measurement that can be used to identify possible weight problems. It estimates body fat based on height and weight. Your health care provider can help determine your BMI and help you achieve or maintain a healthy weight.  For females 20 years of age and older:   A BMI below 18.5 is considered underweight.  A BMI of 18.5 to 24.9 is normal.  A BMI of 25 to 29.9 is considered overweight.  A BMI of 30 and above is considered obese.  Watch levels of cholesterol and blood lipids  You should start having your blood tested for lipids and cholesterol at 47 years of age, then have this test every 5 years.  You may need to have your cholesterol levels checked more often if:  Your lipid or cholesterol levels are high.  You are older than 47 years of age.  You are at high risk for heart disease.  CANCER SCREENING   Lung Cancer  Lung cancer screening is recommended for adults 55-80 years old who are at high risk for lung cancer because of a history of smoking.  A yearly low-dose CT scan of the lungs is recommended for people who:  Currently smoke.  Have quit within the past 15 years.  Have at least a 30-pack-year history of smoking. A pack year is smoking an average of one pack of cigarettes a day for 1 year.  Yearly screening should continue until it has been   15 years since you quit.  Yearly screening should stop if you develop a health problem that would prevent you from having lung cancer treatment.  Breast Cancer  Practice breast self-awareness. This means understanding how your breasts normally appear and feel.  It also means doing regular breast self-exams. Let your health care provider know about any changes, no matter how small.  If you are in your 20s or 30s, you should have a clinical breast exam (CBE) by a health care provider every 1-3 years as part of a  regular health exam.  If you are 40 or older, have a CBE every year. Also consider having a breast X-ray (mammogram) every year.  If you have a family history of breast cancer, talk to your health care provider about genetic screening.  If you are at high risk for breast cancer, talk to your health care provider about having an MRI and a mammogram every year.  Breast cancer gene (BRCA) assessment is recommended for women who have family members with BRCA-related cancers. BRCA-related cancers include:  Breast.  Ovarian.  Tubal.  Peritoneal cancers.  Results of the assessment will determine the need for genetic counseling and BRCA1 and BRCA2 testing. Cervical Cancer Your health care provider may recommend that you be screened regularly for cancer of the pelvic organs (ovaries, uterus, and vagina). This screening involves a pelvic examination, including checking for microscopic changes to the surface of your cervix (Pap test). You may be encouraged to have this screening done every 3 years, beginning at age 21.  For women ages 30-65, health care providers may recommend pelvic exams and Pap testing every 3 years, or they may recommend the Pap and pelvic exam, combined with testing for human papilloma virus (HPV), every 5 years. Some types of HPV increase your risk of cervical cancer. Testing for HPV may also be done on women of any age with unclear Pap test results.  Other health care providers may not recommend any screening for nonpregnant women who are considered low risk for pelvic cancer and who do not have symptoms. Ask your health care provider if a screening pelvic exam is right for you.  If you have had past treatment for cervical cancer or a condition that could lead to cancer, you need Pap tests and screening for cancer for at least 20 years after your treatment. If Pap tests have been discontinued, your risk factors (such as having a new sexual partner) need to be reassessed to  determine if screening should resume. Some women have medical problems that increase the chance of getting cervical cancer. In these cases, your health care provider may recommend more frequent screening and Pap tests. Colorectal Cancer  This type of cancer can be detected and often prevented.  Routine colorectal cancer screening usually begins at 47 years of age and continues through 47 years of age.  Your health care provider may recommend screening at an earlier age if you have risk factors for colon cancer.  Your health care provider may also recommend using home test kits to check for hidden blood in the stool.  A small camera at the end of a tube can be used to examine your colon directly (sigmoidoscopy or colonoscopy). This is done to check for the earliest forms of colorectal cancer.  Routine screening usually begins at age 50.  Direct examination of the colon should be repeated every 5-10 years through 47 years of age. However, you may need to be screened more often if   early forms of precancerous polyps or small growths are found. Skin Cancer  Check your skin from head to toe regularly.  Tell your health care provider about any new moles or changes in moles, especially if there is a change in a mole's shape or color.  Also tell your health care provider if you have a mole that is larger than the size of a pencil eraser.  Always use sunscreen. Apply sunscreen liberally and repeatedly throughout the day.  Protect yourself by wearing long sleeves, pants, a wide-brimmed hat, and sunglasses whenever you are outside. HEART DISEASE, DIABETES, AND HIGH BLOOD PRESSURE   High blood pressure causes heart disease and increases the risk of stroke. High blood pressure is more likely to develop in:  People who have blood pressure in the high end of the normal range (130-139/85-89 mm Hg).  People who are overweight or obese.  People who are African American.  If you are 18-39 years of  age, have your blood pressure checked every 3-5 years. If you are 40 years of age or older, have your blood pressure checked every year. You should have your blood pressure measured twice--once when you are at a hospital or clinic, and once when you are not at a hospital or clinic. Record the average of the two measurements. To check your blood pressure when you are not at a hospital or clinic, you can use:  An automated blood pressure machine at a pharmacy.  A home blood pressure monitor.  If you are between 55 years and 79 years old, ask your health care provider if you should take aspirin to prevent strokes.  Have regular diabetes screenings. This involves taking a blood sample to check your fasting blood sugar level.  If you are at a normal weight and have a low risk for diabetes, have this test once every three years after 47 years of age.  If you are overweight and have a high risk for diabetes, consider being tested at a younger age or more often. PREVENTING INFECTION  Hepatitis B  If you have a higher risk for hepatitis B, you should be screened for this virus. You are considered at high risk for hepatitis B if:  You were born in a country where hepatitis B is common. Ask your health care provider which countries are considered high risk.  Your parents were born in a high-risk country, and you have not been immunized against hepatitis B (hepatitis B vaccine).  You have HIV or AIDS.  You use needles to inject street drugs.  You live with someone who has hepatitis B.  You have had sex with someone who has hepatitis B.  You get hemodialysis treatment.  You take certain medicines for conditions, including cancer, organ transplantation, and autoimmune conditions. Hepatitis C  Blood testing is recommended for:  Everyone born from 1945 through 1965.  Anyone with known risk factors for hepatitis C. Sexually transmitted infections (STIs)  You should be screened for sexually  transmitted infections (STIs) including gonorrhea and chlamydia if:  You are sexually active and are younger than 47 years of age.  You are older than 47 years of age and your health care provider tells you that you are at risk for this type of infection.  Your sexual activity has changed since you were last screened and you are at an increased risk for chlamydia or gonorrhea. Ask your health care provider if you are at risk.  If you do not have HIV, but   are at risk, it may be recommended that you take a prescription medicine daily to prevent HIV infection. This is called pre-exposure prophylaxis (PrEP). You are considered at risk if:  You are sexually active and do not regularly use condoms or know the HIV status of your partner(s).  You take drugs by injection.  You are sexually active with a partner who has HIV. Talk with your health care provider about whether you are at high risk of being infected with HIV. If you choose to begin PrEP, you should first be tested for HIV. You should then be tested every 3 months for as long as you are taking PrEP.  PREGNANCY   If you are premenopausal and you may become pregnant, ask your health care provider about preconception counseling.  If you may become pregnant, take 400 to 800 micrograms (mcg) of folic acid every day.  If you want to prevent pregnancy, talk to your health care provider about birth control (contraception). OSTEOPOROSIS AND MENOPAUSE   Osteoporosis is a disease in which the bones lose minerals and strength with aging. This can result in serious bone fractures. Your risk for osteoporosis can be identified using a bone density scan.  If you are 65 years of age or older, or if you are at risk for osteoporosis and fractures, ask your health care provider if you should be screened.  Ask your health care provider whether you should take a calcium or vitamin D supplement to lower your risk for osteoporosis.  Menopause may have  certain physical symptoms and risks.  Hormone replacement therapy may reduce some of these symptoms and risks. Talk to your health care provider about whether hormone replacement therapy is right for you.  HOME CARE INSTRUCTIONS   Schedule regular health, dental, and eye exams.  Stay current with your immunizations.   Do not use any tobacco products including cigarettes, chewing tobacco, or electronic cigarettes.  If you are pregnant, do not drink alcohol.  If you are breastfeeding, limit how much and how often you drink alcohol.  Limit alcohol intake to no more than 1 drink per day for nonpregnant women. One drink equals 12 ounces of beer, 5 ounces of wine, or 1 ounces of hard liquor.  Do not use street drugs.  Do not share needles.  Ask your health care provider for help if you need support or information about quitting drugs.  Tell your health care provider if you often feel depressed.  Tell your health care provider if you have ever been abused or do not feel safe at home.   This information is not intended to replace advice given to you by your health care provider. Make sure you discuss any questions you have with your health care provider.   Document Released: 05/19/2011 Document Revised: 11/24/2014 Document Reviewed: 10/05/2013 Elsevier Interactive Patient Education 2016 [employer]  

## 2016-03-13 NOTE — Progress Notes (Signed)
   Subjective:    Patient ID: Brandy Ramsey, female    DOB: 1970-12-06, 45 y.o.   MRN: [id number]  HPI  Brandy Ramsey is a 45 yo woman here today for a CPE. Her last was done 1 yr prior by a colleague  Preventative Health Maintenance: Mmg:  Had one 45 yo and wants to wait until 45 yo. Pap: 2013 negative; results not avail in Epic; has mult uterine intramural and subserosal fibroids; sees gyn - 45 yo-45 yo [place]:  Vit/Supp/Herbal: Immunization History  Administered Date(s) Administered  . Hepatitis B 11/18/2003  . Influenza,inj,Quad PF,36+ Mos 07/31/2015  . Influenza-Unspecified 08/17/2013  . 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 Iron On daily for decades  Vit D supp

## 2016-03-14 ENCOUNTER — Telehealth: Payer: Self-pay | Admitting: *Deleted

## 2016-03-14 ENCOUNTER — Encounter: Payer: Self-pay | Admitting: Family Medicine

## 2016-03-14 ENCOUNTER — Other Ambulatory Visit (INDEPENDENT_AMBULATORY_CARE_PROVIDER_SITE_OTHER): Payer: BLUE CROSS/BLUE SHIELD | Admitting: *Deleted

## 2016-03-14 DIAGNOSIS — Z1321 Encounter for screening for nutritional disorder: Secondary | ICD-10-CM

## 2016-03-14 DIAGNOSIS — Z113 Encounter for screening for infections with a predominantly sexual mode of transmission: Secondary | ICD-10-CM

## 2016-03-14 DIAGNOSIS — D509 Iron deficiency anemia, unspecified: Secondary | ICD-10-CM

## 2016-03-14 DIAGNOSIS — Z0000 Encounter for general adult medical examination without abnormal findings: Secondary | ICD-10-CM | POA: Diagnosis not present

## 2016-03-14 DIAGNOSIS — R7302 Impaired glucose tolerance (oral): Secondary | ICD-10-CM

## 2016-03-14 DIAGNOSIS — Z1329 Encounter for screening for other suspected endocrine disorder: Secondary | ICD-10-CM

## 2016-03-14 LAB — CBC
HEMATOCRIT: 39.1 % (ref 35.0–45.0)
HEMOGLOBIN: 12.6 g/dL (ref 11.7–15.5)
MCH: 26.1 pg — ABNORMAL LOW (ref 27.0–33.0)
MCHC: 32.2 g/dL (ref 32.0–36.0)
MCV: 81 fL (ref 80.0–100.0)
MPV: 9.5 fL (ref 7.5–12.5)
Platelets: 383 10*3/uL (ref 140–400)
RBC: 4.83 MIL/uL (ref 3.80–5.10)
RDW: 14.3 % (ref 11.0–15.0)
WBC: 6 10*3/uL (ref 3.8–10.8)

## 2016-03-14 LAB — LIPID PANEL
CHOL/HDL RATIO: 2.9 ratio (ref <=5.0)
CHOLESTEROL: 190 mg/dL (ref 125–200)
HDL: 66 mg/dL (ref >=46)
LDL Cholesterol: 105 mg/dL (ref <130)
Triglycerides: 93 mg/dL (ref <150)
VLDL: 19 mg/dL (ref <30)

## 2016-03-14 LAB — COMPREHENSIVE METABOLIC PANEL
ALBUMIN: 4.7 g/dL (ref 3.6–5.1)
ALK PHOS: 70 U/L (ref 33–115)
ALT: 16 U/L (ref 6–29)
AST: 15 U/L (ref 10–35)
BILIRUBIN TOTAL: 0.6 mg/dL (ref 0.2–1.2)
BUN: 12 mg/dL (ref 7–25)
CALCIUM: 9.6 mg/dL (ref 8.6–10.2)
CO2: 24 mmol/L (ref 20–31)
Chloride: 103 mmol/L (ref 98–110)
Creat: 0.67 mg/dL (ref 0.50–1.10)
Glucose, Bld: 89 mg/dL (ref 65–99)
POTASSIUM: 4.6 mmol/L (ref 3.5–5.3)
Sodium: 137 mmol/L (ref 135–146)
TOTAL PROTEIN: 7.6 g/dL (ref 6.1–8.1)

## 2016-03-14 LAB — HEMOGLOBIN A1C
HEMOGLOBIN A1C: 6 % — AB (ref <5.7)
MEAN PLASMA GLUCOSE: 126 mg/dL

## 2016-03-14 LAB — VITAMIN D 25 HYDROXY (VIT D DEFICIENCY, FRACTURES): Vit D, 25-Hydroxy: 25 ng/mL — ABNORMAL LOW (ref 30–100)

## 2016-03-14 LAB — TSH: TSH: 1.98 m[IU]/L

## 2016-03-14 NOTE — Telephone Encounter (Signed)
Dr Carmie End  Pt did not want the ferritin and HIV ordered I canceled them.

## 2016-03-14 NOTE — Telephone Encounter (Signed)
Yes i though thought i had done that yest

## 2016-03-14 NOTE — Addendum Note (Signed)
Addended by: Kem Boroughs D on: 03/14/2016 09:25 AM   Modules accepted: Orders

## 2017-02-08 NOTE — Progress Notes (Addendum)
Subjective:    Patient ID: Brandy Ramsey, female    DOB: Apr 13, 1971, 46 y.o.   MRN: [id number] Chief Complaint  Patient presents with  . Insomnia  . Medication Refill    Ativan     HPI  Brandy Ramsey is a 46 yo woman who is here to discuss insomnia.  I last saw hre 11 mos prior for her CPE.  Anxiety: mild, situational treated by prn lorazepam. Has failed buspar 2 yrs prior, celexa 3 years, and effexor 3 years prior.  Takes the lorazepam as needed for sleep up to 3x/wk at most frequent - more often when she is working as gets stressed out about everything getting down. No hangover effect. She usually will wait until midnight and then if not asleep take one. Sxs are very situational  Developed pre-diabetes 2016 with hgba1c 5.7-> 6.2 -> 6.0 H/o IDA: on daily iron supp - pt refused any iron level and has not had prior. 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  Past Medical History:  Diagnosis Date  . Anemia   . Gestational diabetes mellitus in pregnancy   . Thyroid enlargement   . Uterine fibroid 2013   multiple intramural and subserosal   No past surgical history on file. Current Outpatient Prescriptions on File Prior to Visit  Medication Sig Dispense Refill  . calcium carbonate (OS-CAL) 600 MG TABS Take 600 mg by mouth 2 (two) times daily with a meal.    . ferrous fumarate (HEMOCYTE - 106 MG FE) 325 (106 FE) MG TABS Take 1 tablet by mouth 2 (two) times daily.    . Multiple Vitamin (MULTIVITAMIN) tablet Take 1 tablet by mouth daily.     No current facility-administered medications on file prior to visit.    No Known Allergies Family History  Problem Relation Age of Onset  . Hypertension Mother   . Diabetes Mother   . Hypertension Father    Social  History   Social History  . Marital status: Married    Spouse name: N/A  . Number of children: N/A  . Years of education: N/A   Occupational History  . physician [place] History Main Topics  . Smoking status: Never Smoker  . Smokeless tobacco: Never Used  . Alcohol use No  . Drug use: No  . Sexual activity: Yes    Birth control/ protection: Condom     Comment: number of sex partners in the last 64 months 1   Other Topics Concern  . Not on file   Social History Narrative   Exercise on treadmill qod. Married. Education: Freight forwarder. Exercise: Yes.   Depression screen [facility] 2/9 02/09/2017 03/13/2016 03/09/2015 08/25/2014  Decreased Interest 0 0 0 1  Down, Depressed, Hopeless 0 0 0 1  PHQ - 2 Score 0 0 0 2  Altered sleeping - - - 1  Tired, decreased energy - - - 1  Change in appetite - - - 0  Feeling bad or failure about yourself  - - - 0  Trouble concentrating - - - 1  Moving slowly or fidgety/restless - - - 0  Suicidal thoughts - - - 0  PHQ-9 Score - - - 5     Review of Systems  Constitutional: Negative for activity change, appetite change, chills, diaphoresis, fatigue, fever and unexpected weight change.  Cardiovascular: Negative for chest pain.  Gastrointestinal: Negative for nausea and vomiting.  Psychiatric/Behavioral: Positive for sleep disturbance. Negative for agitation, behavioral problems, confusion and dysphoric mood. The patient is nervous/anxious. The patient is not hyperactive.        Objective:   Physical Exam  Constitutional: She is oriented to person, place, and time. She appears well-developed and well-nourished. No distress.  HENT:  Head: Normocephalic and atraumatic.  Right Ear: External ear normal.  Left Ear: External ear normal.  Eyes: Conjunctivae are normal. No scleral icterus.  Neck: Normal range of motion. Neck supple. Thyromegaly present.  Cardiovascular: Normal rate, regular rhythm, normal heart sounds and intact distal  pulses.   Pulmonary/Chest: Effort normal and breath sounds normal. No respiratory distress.  Musculoskeletal: She exhibits no edema.  Lymphadenopathy:    She has no cervical adenopathy.  Neurological: She is alert and oriented to person, place, and time.  Skin: Skin is warm and dry. She is not diaphoretic. No erythema.  Psychiatric: She has a normal mood and affect. Her behavior is normal.         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 No difficulty sleeping during vacations. Uses prn lorazepam 0.5mg  sparingly - at most 3x/wk. Very aware of the risks of chronic bzd use but is working very well for her so will continue. Medication refills. F/u in 1 yr for additional medication refills.        Thyromegaly - pt reports she's had mult [place] prior - all reassuring. Last was 2010 in epic which notes it is unremarkable.  Meds ordered this encounter  Medications  . LORazepam (ATIVAN) 0.5 MG tablet    Sig: Take 1 tablet (0.5 mg total) by mouth 2 (two) times daily as needed for anxiety.    Dispense:  30 tablet    Refill:  5    Pt declines repeat labs for cbc and a1c at this time. Plans to make an appt for a CPE to address.  Delman Cheadle, M.D.  Primary Care at [facility] [address] [place], [place] [address] [phone] phone [phone] fax  02/09/17 1:26 PM

## 2017-02-09 ENCOUNTER — Encounter: Payer: Self-pay | Admitting: Family Medicine

## 2017-02-09 ENCOUNTER — Ambulatory Visit (INDEPENDENT_AMBULATORY_CARE_PROVIDER_SITE_OTHER): Payer: BLUE CROSS/BLUE SHIELD | Admitting: Family Medicine

## 2017-02-09 VITALS — BP 136/87 | HR 96 | Temp 99.0°F | Resp 18 | Ht 61.0 in | Wt 134.0 lb

## 2017-02-09 DIAGNOSIS — F5102 Adjustment insomnia: Secondary | ICD-10-CM | POA: Insufficient documentation

## 2017-02-09 MED ORDER — LORAZEPAM 0.5 MG PO TABS: 0.5000 mg | ORAL_TABLET | Freq: Two times a day (BID) | ORAL | 5 refills | Status: DC | PRN

## 2017-02-09 NOTE — Assessment & Plan Note (Addendum)
Due to stress from job. No difficulty sleeping during vacations. Uses prn lorazepam 0.5mg  sparingly - at most 3x/wk. Very aware of the risks of chronic bzd use but is working very well for her so will continue. Medication refills. F/u in 1 yr for additional medication refills.

## 2017-02-09 NOTE — Patient Instructions (Signed)
     IF you received an x-ray today, you will receive an invoice from [facility]. Please contact [facility] at [phone] with questions or concerns regarding your invoice.   IF you received labwork today, you will receive an invoice from [facility]. Please contact [facility] at [phone] with questions or concerns regarding your invoice.   Our billing staff will not be able to assist you with questions regarding bills from these companies.  You will be contacted with the lab results as soon as they are available. The fastest way to get your results is to activate your My Chart account. Instructions are located on the last page of this paperwork. If you have not heard from us regarding the results in 2 weeks, please contact this office.     

## 2017-02-10 MED ORDER — FERROUS FUMARATE 325 (106 FE) MG PO TABS
1.0000 | ORAL_TABLET | Freq: Every day | ORAL | Status: DC
Start: 2017-02-10 — End: 2020-08-16

## 2017-02-10 MED ORDER — CALCIUM CARB-CHOLECALCIFEROL 600-400 MG-UNIT PO TABS: 2.0000 | ORAL_TABLET | Freq: Every day | ORAL | Status: DC

## 2017-02-10 MED ORDER — LORAZEPAM 0.5 MG PO TABS: 0.5000 mg | ORAL_TABLET | Freq: Every evening | ORAL | 5 refills | Status: DC | PRN

## 2017-02-10 NOTE — Addendum Note (Signed)
Addended by: Delman Cheadle on: 02/10/2017 06:42 PM   Modules accepted: Orders

## 2017-08-17 ENCOUNTER — Encounter: Payer: BLUE CROSS/BLUE SHIELD | Admitting: Family Medicine

## 2017-11-02 ENCOUNTER — Ambulatory Visit (INDEPENDENT_AMBULATORY_CARE_PROVIDER_SITE_OTHER): Payer: BLUE CROSS/BLUE SHIELD | Admitting: Family Medicine

## 2017-11-02 ENCOUNTER — Other Ambulatory Visit: Payer: Self-pay

## 2017-11-02 ENCOUNTER — Encounter: Payer: Self-pay | Admitting: Family Medicine

## 2017-11-02 VITALS — BP 112/80 | HR 74 | Temp 98.1°F | Resp 16 | Ht 61.0 in | Wt 132.4 lb

## 2017-11-02 DIAGNOSIS — Z0000 Encounter for general adult medical examination without abnormal findings: Secondary | ICD-10-CM | POA: Diagnosis not present

## 2017-11-02 DIAGNOSIS — F5105 Insomnia due to other mental disorder: Secondary | ICD-10-CM | POA: Diagnosis not present

## 2017-11-02 DIAGNOSIS — D500 Iron deficiency anemia secondary to blood loss (chronic): Secondary | ICD-10-CM

## 2017-11-02 DIAGNOSIS — Z566 Other physical and mental strain related to work: Secondary | ICD-10-CM

## 2017-11-02 DIAGNOSIS — R7303 Prediabetes: Secondary | ICD-10-CM

## 2017-11-02 DIAGNOSIS — F99 Mental disorder, not otherwise specified: Secondary | ICD-10-CM

## 2017-11-02 DIAGNOSIS — E559 Vitamin D deficiency, unspecified: Secondary | ICD-10-CM

## 2017-11-02 MED ORDER — SUVOREXANT 20 MG PO TABS: 20.0000 mg | ORAL_TABLET | Freq: Every day | ORAL | 0 refills | Status: DC

## 2017-11-02 MED ORDER — LORAZEPAM 0.5 MG PO TABS: 0.5000 mg | ORAL_TABLET | Freq: Every evening | ORAL | 5 refills | Status: DC | PRN

## 2017-11-02 MED ORDER — SUVOREXANT 10 MG PO TABS: 1.0000 | ORAL_TABLET | Freq: Every day | ORAL | 0 refills | Status: DC

## 2017-11-02 MED ORDER — SUVOREXANT 15 MG PO TABS: 15.0000 mg | ORAL_TABLET | Freq: Every day | ORAL | 0 refills | Status: DC

## 2017-11-02 NOTE — Progress Notes (Signed)
Subjective:    Patient ID: Brandy Ramsey, female    DOB: 03-Nov-1971, 46 y.o.   MRN: [id number] Chief Complaint  Patient presents with  . 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 Raphael Gibney at [facility]. Nml 08/03/17 (no hpv testing so repeat in 3 yrs - 2021).  has mult uterine intramural and subserosal fibroids; Family Planning: menses irreg - perimenopausal STI screening: refuses -> married monogamous relationship Breast Cancer: Done at Dr. Doran Stabler office 08/03/2017 Colorectal Cancer: NA due to age Tobacco use/EtOH/substances: none Bone Density: on ca/vit D supp Cardiac:  Not sure if covered by insurance so declines today.  Weight/Blood sugar/Diet/Exercise: BMI Readings from Last 3 Encounters:  11/02/17 25.02 kg/m  02/09/17 25.32 kg/m  03/13/16 24.19 kg/m   08/03/2017 HgbA1C 5.9 at [facility] (scanned in) Lab Results  Component Value Date   HGBA1C 6.0 (H) 03/14/2016   HGBA1C 6.2 (H) 03/15/2015   HGBA1C 5.7 12/03/2012    OTC/Vit/Supp/Herbal: ca/vit D taking 1035m/total in 2 pills daily - advised splitting the pills apart to take 500 bid, iron, melatonin 10 qhs [facility] [place]  Immunization History  Administered Date(s) Administered  . Hepatitis B 11/18/2003  . Influenza,inj,Quad PF,6+ Mos 07/31/2015  . Influenza-Unspecified 08/17/2013, 08/17/2016, 06/17/2017  . Tdap 11/06/2009     Chronic Medical Conditions: Did double up on iron during times of metrorrhagia.  Thinks it is perimenopausal.  Now w/ periods that are minimal but at times heavier. Still on single iron supplement daily Finished 8 wk course of vitamin D -    Not  fasting today.  Wakes up at 2-2:30 freq, could be menopausal.  But helped by exercise/meditation. Will take a lorazepam if she wakes up and has to work the next day. It is effective.    Past Medical History:  Diagnosis Date  . Anemia   . Gestational diabetes mellitus in pregnancy   . Thyroid enlargement   . Uterine fibroid 2013   multiple intramural and subserosal   History reviewed. No pertinent surgical history. Current Outpatient Medications on File Prior to Visit  Medication Sig Dispense Refill  . Calcium Carb-Cholecalciferol 600-400 MG-UNIT TABS Take 2 tablets by mouth daily. 60 tablet   . ferrous fumarate (HEMOCYTE - 106 MG FE) 325 (106 Fe) MG TABS tablet Take 1 tablet (106 mg of iron total) by mouth daily. 30 each   . LORazepam (ATIVAN) 0.5 MG tablet Take 1 tablet (0.5 mg total) by mouth at bedtime as needed for sleep. 30 tablet 5  . Multiple Vitamin (MULTIVITAMIN) tablet Take 1 tablet by mouth daily.     No current facility-administered medications on file prior to visit.    No Known Allergies Family History  Problem Relation Age of Onset  . Hypertension Mother   . Diabetes Mother   . Hypertension Father    Social History   Socioeconomic History  . Marital status: Married    Spouse name: None  . Number of children: None  . Years of education: None  . Highest education level: None  Social Needs  . Financial resource strain: None  . Food insecurity - worry: None  . Food insecurity - inability:  None  . Transportation needs - medical: None  . Transportation needs - non-medical: None  Occupational History  . Occupation: physician    Employer: [employer]  Tobacco Use  . Smoking status: Never Smoker  . Smokeless tobacco: Never Used  Substance and Sexual Activity  . Alcohol use: No    Alcohol/week: 0.0 oz  . Drug use: No  . Sexual activity: Yes    Birth control/protection: Condom    Comment: number of sex partners in the last 44 months 1  Other Topics Concern   . None  Social History Narrative   Exercise on treadmill qod. Married. Education: Freight forwarder. Exercise: Yes.   Depression screen [facility] 2/9 02/09/2017 03/13/2016 03/09/2015 08/25/2014  Decreased Interest 0 0 0 1  Down, Depressed, Hopeless 0 0 0 1  PHQ - 2 Score 0 0 0 2  Altered sleeping - - - 1  Tired, decreased energy - - - 1  Change in appetite - - - 0  Feeling bad or failure about yourself  - - - 0  Trouble concentrating - - - 1  Moving slowly or fidgety/restless - - - 0  Suicidal thoughts - - - 0  PHQ-9 Score - - - 5     Review of Systems  All other systems reviewed and are negative. unless otherwise noted in HPI     Objective:   Physical Exam  Constitutional: She is oriented to person, place, and time. She appears well-developed and well-nourished. No distress.  HENT:  Head: Normocephalic and atraumatic.  Right Ear: Tympanic membrane, external ear and ear canal normal.  Left Ear: Tympanic membrane, external ear and ear canal normal.  Nose: Nose normal. No mucosal edema or rhinorrhea.  Mouth/Throat: Uvula is midline, oropharynx is clear and moist and mucous membranes are normal. No posterior oropharyngeal erythema.  Eyes: Conjunctivae and EOM are normal. Pupils are equal, round, and reactive to light. Right eye exhibits no discharge. Left eye exhibits no discharge. No scleral icterus.  Neck: Normal range of motion. Neck supple. No thyromegaly present.  Cardiovascular: Normal rate, regular rhythm, normal heart sounds and intact distal pulses.  Pulmonary/Chest: Effort normal and breath sounds normal. No respiratory distress.  Abdominal: Soft. Bowel sounds are normal. There is no tenderness.  Genitourinary: No breast swelling, tenderness, discharge or bleeding.  Musculoskeletal: She exhibits no edema.  Lymphadenopathy:    She has no cervical adenopathy.    She has no axillary adenopathy.       Right: No supraclavicular adenopathy present.       Left: No supraclavicular  adenopathy present.  Neurological: She is alert and oriented to person, place, and time. She has normal reflexes.  Skin: Skin is warm and dry. She is not diaphoretic. No erythema.  Psychiatric: She has a normal mood and affect. Her behavior is normal.   BP 112/80   Pulse 74   Temp 98.1 F (36.7 C)   Resp 16   Ht 5' 1"  (1.549 m)   Wt 132 lb 6.4 oz (60.1 kg)   SpO2 99%   BMI 25.02 kg/m   LABs done 08/03/2017 at [facility] Dr. Raphael Gibney brought in by pt today for review and have been scanned in.   ThinPrep PAP with reflex HPV 08/03/2017 NEGATIVE/NORMAL so hpv not tested prolactin nml 9.9 ng/mL (non-preg 3-30, postmenopausal 2-20) TSH 3.12 mIU/L Vitamin D,25-OH,TOTAL,IA 23 ng/mL (nml 30-100) - insufficiency CBC: wbc 8.9, hgb 10.4 g/dL, hct 32.3%, MCV 81.4 fL, plt 399 thousand/uL (  nml 140-400)  CMP: fasting glucose 135, Cr 0.74 mg/dL, eGFR 96, K 4.3, tot prot 6.9, alb 4.5, alk phos 73, ast 15, alt 17 HEMOGLOBIN A1C: 5.9   Assessment & Plan:   1. Annual physical exam   2. Insomnia due to other mental disorder - due to work stress, worries at night when all the demands during the day have quieted.  Has done well on rare prn ativan but she is worried about it being a controlled drug and the risk of bzd dependence even with its use <1/2 of days so would like to try belsomra on rec from a psychiatrist friend.  3. Stress at work   4.      Iron deficiency anemia due to chronic blood loss - menses slowing as perimenopausal but still taking iron qd and takes bid during times of heavy menstrual flow. Hgb nml last yr 02/2016 12.6 -> 10.4 at gyn 3 mos prior 5.      Vitamin D deficiency - was 23 at gyn 3 mos ago - gyn put her on 8 wk course of once wkly high dose vit D - now just taking whatever iu is in her 553m calcium supp daily 6.      Prediabetes - improved slightly from last yr 6.0 -> 5.9 when checked almost 3 mos prior at gyn  Meds ordered this encounter  Medications  .  Suvorexant (BELSOMRA) 10 MG TABS    Sig: Take 1 tablet by mouth at bedtime.    Dispense:  10 tablet    Refill:  0  . Suvorexant (BELSOMRA) 15 MG TABS    Sig: Take 15 mg by mouth at bedtime.    Dispense:  10 tablet    Refill:  0  . Suvorexant (BELSOMRA) 20 MG TABS    Sig: Take 20 mg by mouth at bedtime.    Dispense:  10 tablet    Refill:  0  . LORazepam (ATIVAN) 0.5 MG tablet    Sig: Take 1 tablet (0.5 mg total) by mouth at bedtime as needed for sleep.    Dispense:  30 tablet    Refill:  5   EDelman Cheadle M.D.  [facility] [phone]  11/08/17 11:26 PM

## 2017-11-02 NOTE — Patient Instructions (Addendum)
IF you received an x-ray today, you will receive an invoice from [facility], [facility]. Please contact [facility] at [phone] with questions or concerns regarding your invoice.   IF you received labwork today, you will receive an invoice from [facility]. Please contact [facility] at [phone] with questions or concerns regarding your invoice.   Our billing staff will not be able to assist you with questions regarding bills from these companies.  You will be contacted with the lab results as soon as they are available. The fastest way to get your results is to activate your My Chart account. Instructions are located on the last page of this paperwork. If you have not heard from [place] regarding the results in 2 weeks, please contact this office.      Calcium Intake Recommendations Calcium is a mineral that affects many functions in the body, including:  Blood clotting.  Blood vessel function.  Nerve impulse conduction.  Hormone secretion.  Muscle contraction.  Bone and teeth functions.  Most of your body's calcium supply is stored in your bones and teeth. When your calcium stores are low, you may be at risk for low bone mass, bone loss, and bone fractures. Consuming enough calcium helps to grow healthy bones and teeth and to prevent breakdown over time. It is very important that you get enough calcium if you are:  A child undergoing rapid growth.  An adolescent girl.  A pre- or post-menopausal woman.  A woman whose menstrual cycle has stopped due to anorexia nervosa or regular intense exercise.  An individual with lactose intolerance or a milk allergy.  A vegetarian.  What is my plan? Try to consume the recommended amount of calcium daily based on your age. Depending on your overall health, your health care provider may recommend increased calcium intake.General daily calcium intake recommendations by age are:  Birth to 6 months: 200 mg.  Infants 7 to 12  months: 260 mg.  Children 1 to 3 years: 700 mg.  Children 4 to 8 years: 1,000 mg.  Children 9 to 13 years: 1,300 mg.  Teens 14 to 18 years: 1,300 mg.  Adults 19 to 50 years: 1,000 mg.  Adult women 51 to 70 years: 1,200 mg.  Adult men 51 to 70 years: 1,000 mg.  Adults 71 years and older: 1,200 mg.  Pregnant and breastfeeding teens: 1,300 mg.  Pregnant and breastfeeding adults: 1,000 mg.  What do I need to know about calcium intake?  In order for the body to absorb calcium, it needs vitamin D. You can get vitamin D through: ? Direct exposure of the skin to sunlight. ? Foods, such as egg yolks, liver, saltwater fish, and fortified milk. ? Supplements.  Consuming too much calcium may cause: ? Constipation. ? Decreased absorption of iron and zinc. ? Kidney stones.  Calcium supplements may interact with certain medicines. Check with your health care provider before starting any calcium supplements.  Try to get most of your calcium from food. What foods can I eat? Grains  Fortified oatmeal. Fortified ready-to-eat cereals. Fortified frozen waffles. Vegetables Turnip greens. Broccoli. Fruits Fortified orange juice. Meats and Other Protein Sources Canned sardines with bones. Canned salmon with bones. Soy beans. Tofu. Baked beans. Almonds. [place] nuts. Sunflower seeds. Dairy Milk. Yogurt. Cheese. Cottage cheese. Beverages Fortified soy milk. Fortified rice milk. Sweets/Desserts Pudding. Ice Cream. Milkshakes. Blackstrap molasses. The items listed above may not be a complete list of recommended foods or beverages. Contact your dietitian for more  options. What foods can affect my calcium intake? It may be more difficult for your body to use calcium or calcium may leave your body more quickly if you consume large amounts of:  Sodium.  Protein.  Caffeine.  Alcohol.  This information is not intended to replace advice given to you by your health care provider. Make  sure you discuss any questions you have with your health care provider. Document Released: 06/17/2004 Document Revised: 05/23/2016 Document Reviewed: 04/11/2014 Elsevier Interactive Patient Education  2018 [place] Maintenance for Postmenopausal Women Menopause is a normal process in which your reproductive ability comes to an end. This process happens gradually over a span of months to years, usually between the ages of 14 and 80. Menopause is complete when you have missed 12 consecutive menstrual periods. It is important to talk with your health care provider about some of the most common conditions that affect postmenopausal women, such as heart disease, cancer, and bone loss (osteoporosis). Adopting a healthy lifestyle and getting preventive care can help to promote your health and wellness. Those actions can also lower your chances of developing some of these common conditions. What should I know about menopause? During menopause, you may experience a number of symptoms, such as:  Moderate-to-severe hot flashes.  Night sweats.  Decrease in sex drive.  Mood swings.  Headaches.  Tiredness.  Irritability.  Memory problems.  Insomnia.  Choosing to treat or not to treat menopausal changes is an individual decision that you make with your health care provider. What should I know about hormone replacement therapy and supplements? Hormone therapy products are effective for treating symptoms that are associated with menopause, such as hot flashes and night sweats. Hormone replacement carries certain risks, especially as you become older. If you are thinking about using estrogen or estrogen with progestin treatments, discuss the benefits and risks with your health care provider. What should I know about heart disease and stroke? Heart disease, heart attack, and stroke become more likely as you age. This may be due, in part, to the hormonal changes that your body experiences  during menopause. These can affect how your body processes dietary fats, triglycerides, and cholesterol. Heart attack and stroke are both medical emergencies. There are many things that you can do to help prevent heart disease and stroke:  Have your blood pressure checked at least every 1-2 years. High blood pressure causes heart disease and increases the risk of stroke.  If you are 96-13 years old, ask your health care provider if you should take aspirin to prevent a heart attack or a stroke.  Do not use any tobacco products, including cigarettes, chewing tobacco, or electronic cigarettes. If you need help quitting, ask your health care provider.  It is important to eat a healthy diet and maintain a healthy weight. ? Be sure to include plenty of vegetables, fruits, low-fat dairy products, and lean protein. ? Avoid eating foods that are high in solid fats, added sugars, or salt (sodium).  Get regular exercise. This is one of the most important things that you can do for your health. ? Try to exercise for at least 150 minutes each week. The type of exercise that you do should increase your heart rate and make you sweat. This is known as moderate-intensity exercise. ? Try to do strengthening exercises at least twice each week. Do these in addition to the moderate-intensity exercise.  Know your numbers.Ask your health care provider to check your cholesterol and your  blood glucose. Continue to have your blood tested as directed by your health care provider.  What should I know about cancer screening? There are several types of cancer. Take the following steps to reduce your risk and to catch any cancer development as early as possible. Breast Cancer  Practice breast self-awareness. ? This means understanding how your breasts normally appear and feel. ? It also means doing regular breast self-exams. Let your health care provider know about any changes, no matter how small.  If you are 4 or  older, have a clinician do a breast exam (clinical breast exam or CBE) every year. Depending on your age, family history, and medical history, it may be recommended that you also have a yearly breast X-ray (mammogram).  If you have a family history of breast cancer, talk with your health care provider about genetic screening.  If you are at high risk for breast cancer, talk with your health care provider about having an MRI and a mammogram every year.  Breast cancer (BRCA) gene test is recommended for women who have family members with BRCA-related cancers. Results of the assessment will determine the need for genetic counseling and BRCA1 and for BRCA2 testing. BRCA-related cancers include these types: ? Breast. This occurs in males or females. ? Ovarian. ? Tubal. This may also be called fallopian tube cancer. ? Cancer of the abdominal or pelvic lining (peritoneal cancer). ? Prostate. ? Pancreatic.  Cervical, Uterine, and Ovarian Cancer Your health care provider may recommend that you be screened regularly for cancer of the pelvic organs. These include your ovaries, uterus, and vagina. This screening involves a pelvic exam, which includes checking for microscopic changes to the surface of your cervix (Pap test).  For women ages 21-65, health care providers may recommend a pelvic exam and a Pap test every three years. For women ages 41-65, they may recommend the Pap test and pelvic exam, combined with testing for human papilloma virus (HPV), every five years. Some types of HPV increase your risk of cervical cancer. Testing for HPV may also be done on women of any age who have unclear Pap test results.  Other health care providers may not recommend any screening for nonpregnant women who are considered low risk for pelvic cancer and have no symptoms. Ask your health care provider if a screening pelvic exam is right for you.  If you have had past treatment for cervical cancer or a condition that  could lead to cancer, you need Pap tests and screening for cancer for at least 20 years after your treatment. If Pap tests have been discontinued for you, your risk factors (such as having a new sexual partner) need to be reassessed to determine if you should start having screenings again. Some women have medical problems that increase the chance of getting cervical cancer. In these cases, your health care provider may recommend that you have screening and Pap tests more often.  If you have a family history of uterine cancer or ovarian cancer, talk with your health care provider about genetic screening.  If you have vaginal bleeding after reaching menopause, tell your health care provider.  There are currently no reliable tests available to screen for ovarian cancer.  Lung Cancer Lung cancer screening is recommended for adults 60-22 years old who are at high risk for lung cancer because of a history of smoking. A yearly low-dose CT scan of the lungs is recommended if you:  Currently smoke.  Have a history of  at least 30 pack-years of smoking and you currently smoke or have quit within the past 15 years. A pack-year is smoking an average of one pack of cigarettes per day for one year.  Yearly screening should:  Continue until it has been 15 years since you quit.  Stop if you develop a health problem that would prevent you from having lung cancer treatment.  Colorectal Cancer  This type of cancer can be detected and can often be prevented.  Routine colorectal cancer screening usually begins at age 68 and continues through age 38.  If you have risk factors for colon cancer, your health care provider may recommend that you be screened at an earlier age.  If you have a family history of colorectal cancer, talk with your health care provider about genetic screening.  Your health care provider may also recommend using home test kits to check for hidden blood in your stool.  A small camera  at the end of a tube can be used to examine your colon directly (sigmoidoscopy or colonoscopy). This is done to check for the earliest forms of colorectal cancer.  Direct examination of the colon should be repeated every 5-10 years until age 66. However, if early forms of precancerous polyps or small growths are found or if you have a family history or genetic risk for colorectal cancer, you may need to be screened more often.  Skin Cancer  Check your skin from head to toe regularly.  Monitor any moles. Be sure to tell your health care provider: ? About any new moles or changes in moles, especially if there is a change in a mole's shape or color. ? If you have a mole that is larger than the size of a pencil eraser.  If any of your family members has a history of skin cancer, especially at a young age, talk with your health care provider about genetic screening.  Always use sunscreen. Apply sunscreen liberally and repeatedly throughout the day.  Whenever you are outside, protect yourself by wearing long sleeves, pants, a wide-brimmed hat, and sunglasses.  What should I know about osteoporosis? Osteoporosis is a condition in which bone destruction happens more quickly than new bone creation. After menopause, you may be at an increased risk for osteoporosis. To help prevent osteoporosis or the bone fractures that can happen because of osteoporosis, the following is recommended:  If you are 22-34 years old, get at least 1,000 mg of calcium and at least 600 mg of vitamin D per day.  If you are older than age 9 but younger than age 86, get at least 1,200 mg of calcium and at least 600 mg of vitamin D per day.  If you are older than age 68, get at least 1,200 mg of calcium and at least 800 mg of vitamin D per day.  Smoking and excessive alcohol intake increase the risk of osteoporosis. Eat foods that are rich in calcium and vitamin D, and do weight-bearing exercises several times each week as  directed by your health care provider. What should I know about how menopause affects my mental health? Depression may occur at any age, but it is more common as you become older. Common symptoms of depression include:  Low or sad mood.  Changes in sleep patterns.  Changes in appetite or eating patterns.  Feeling an overall lack of motivation or enjoyment of activities that you previously enjoyed.  Frequent crying spells.  Talk with your health care provider if  you think that you are experiencing depression. What should I know about immunizations? It is important that you get and maintain your immunizations. These include:  Tetanus, diphtheria, and pertussis (Tdap) booster vaccine.  Influenza every year before the flu season begins.  Pneumonia vaccine.  Shingles vaccine.  Your health care provider may also recommend other immunizations. This information is not intended to replace advice given to you by your health care provider. Make sure you discuss any questions you have with your health care provider. Document Released: 12/26/2005 Document Revised: 05/23/2016 Document Reviewed: 08/07/2015 Elsevier Interactive Patient Education  2018 [employer].

## 2017-11-04 ENCOUNTER — Telehealth: Payer: Self-pay

## 2017-11-04 NOTE — Telephone Encounter (Signed)
Submitted request through cover my meds.  Should have answer within 3 business days.

## 2017-11-08 DIAGNOSIS — E559 Vitamin D deficiency, unspecified: Secondary | ICD-10-CM | POA: Insufficient documentation

## 2017-11-09 ENCOUNTER — Encounter: Payer: BLUE CROSS/BLUE SHIELD | Admitting: Family Medicine

## 2017-11-09 NOTE — Telephone Encounter (Signed)
Dr. Brigitte Pulse, Belsomra was denied by patient's insurance.  An alternate medication was not given.  I left a message on patient's VM to notify her. Patient was advised to CB if she would like a different medication sent into pharmacy. I will keep you posted!   Let me know if I can do anything else, Dejay Kronk

## 2017-11-10 NOTE — Telephone Encounter (Signed)
There should be a code the pharmacist can use from the coupon Dr. Durwin Reges was given (and if expired 11/16/17 can generate a new one by going to the [employer] website) so that she can get the 30d (10d of the 3 different doses) free trial even without insurance approval if she would still like to try the med - she would have to ask her pharmacist to contact the pharmaceutic rep to trouble shot if having trouble even getting the free trial.  Then she could at least know whether it would be something worth looking into again in future years when/if it is on her formulary. . Marina Gravel

## 2017-11-11 NOTE — Telephone Encounter (Signed)
LMVM explaining Dr. Raul Del message.  (See previous note) Left my direct number for patient incase there are questions.

## 2017-11-15 ENCOUNTER — Encounter: Payer: Self-pay | Admitting: Family Medicine

## 2017-12-10 ENCOUNTER — Telehealth: Payer: Self-pay

## 2017-12-10 NOTE — Telephone Encounter (Signed)
Started PA for [employer].  Key is [id number].  Should have a response within 3-5 business days.

## 2017-12-14 ENCOUNTER — Telehealth: Payer: Self-pay | Admitting: Family Medicine

## 2017-12-14 NOTE — Telephone Encounter (Signed)
Copied from [place]. Topic: Quick Communication >> Dec 14, 2017 11:49 AM Tye [place] wrote: Suvorexant [facility]) 20 MG TABS [[id number]], pharmacy states they need a prior auth to fill the Rx, contact [id number] - [place], [place] [address]

## 2017-12-15 NOTE — Telephone Encounter (Signed)
Via chart notes, PA was denied and patient made aware. Pharmacy notified.

## 2017-12-15 NOTE — Telephone Encounter (Signed)
Please see note below. 

## 2017-12-17 ENCOUNTER — Telehealth: Payer: Self-pay

## 2017-12-17 NOTE — Telephone Encounter (Signed)
Sent MyChart message with Dr. Manya Silvas note.

## 2017-12-17 NOTE — Telephone Encounter (Signed)
Sent MyChart note to pt with Dr. Manya Silvas message

## 2017-12-17 NOTE — Telephone Encounter (Signed)
[employer] stated that she has to first try the "alternative medications [which] include zolpidem (Ambien), eszopiclone(Lunesta) , zaleplon (Sonata), etc" which are all "sedative-hypnotics" similar to Ambien - just that Lunesta lasts twice as long as Ambien and Sonata lasts ~1/3 as long as Ambien. Would she like to try one of these meds instead or just stick with her prn lorazepam?

## 2018-01-15 ENCOUNTER — Telehealth: Payer: Self-pay | Admitting: Family Medicine

## 2018-01-15 NOTE — Telephone Encounter (Signed)
Copied from [place] [phone]. Topic: Quick Communication - See Telephone Encounter >> Jan 15, 2018  2:39 PM Antonieta Iba C wrote: CRM for notification. See Telephone encounter for: pt called in because she said that she was advised to use hypnotics to help with her with sleeping. Pt says that she decline but would like to take Trazadone 12.5 MG to help instead. Pt says if provider is okay with this could she receive a Rx?    [facility]acy: [id number] [address], [place] (Phone) [phone] (Fax)    Please advise : pt would like to know if she could have a message response via My-Chart.   01/15/18.

## 2018-01-16 NOTE — Telephone Encounter (Signed)
Patient message sent to Dr. Brigitte Pulse re: requesting rx for Trazadone

## 2018-01-18 ENCOUNTER — Encounter: Payer: Self-pay | Admitting: Family Medicine

## 2018-01-18 MED ORDER — TRAZODONE HCL 50 MG PO TABS: 25.0000 mg | ORAL_TABLET | Freq: Every evening | ORAL | 3 refills | Status: DC | PRN

## 2018-01-18 MED ORDER — HYDROXYZINE HCL 25 MG PO TABS: 12.5000 mg | ORAL_TABLET | Freq: Every evening | ORAL | 3 refills | Status: DC | PRN

## 2018-01-18 NOTE — Telephone Encounter (Signed)
Sent in med and sent pt mychart message.

## 2018-04-18 ENCOUNTER — Telehealth: Payer: Self-pay | Admitting: Family Medicine

## 2018-04-19 NOTE — Telephone Encounter (Signed)
Pt would like to have call back with explanation of why med is denied.  cb is [id number]  Pt want to pay out of pocket for this med, it is not covered by her ins.

## 2018-04-19 NOTE — Telephone Encounter (Signed)
Pt advised that she needs an office visit.

## 2018-04-26 ENCOUNTER — Encounter: Payer: Self-pay | Admitting: Family Medicine

## 2018-08-19 DIAGNOSIS — N920 Excessive and frequent menstruation with regular cycle: Secondary | ICD-10-CM | POA: Diagnosis not present

## 2018-08-19 DIAGNOSIS — Z01419 Encounter for gynecological examination (general) (routine) without abnormal findings: Secondary | ICD-10-CM | POA: Diagnosis not present

## 2018-08-19 DIAGNOSIS — D649 Anemia, unspecified: Secondary | ICD-10-CM | POA: Diagnosis not present

## 2018-08-19 DIAGNOSIS — N946 Dysmenorrhea, unspecified: Secondary | ICD-10-CM | POA: Diagnosis not present

## 2018-08-20 ENCOUNTER — Other Ambulatory Visit: Payer: Self-pay | Admitting: Obstetrics and Gynecology

## 2018-08-20 DIAGNOSIS — N920 Excessive and frequent menstruation with regular cycle: Secondary | ICD-10-CM | POA: Diagnosis not present

## 2018-08-20 DIAGNOSIS — N946 Dysmenorrhea, unspecified: Secondary | ICD-10-CM | POA: Diagnosis not present

## 2018-08-20 DIAGNOSIS — D259 Leiomyoma of uterus, unspecified: Secondary | ICD-10-CM | POA: Diagnosis not present

## 2018-08-20 HISTORY — PX: ENDOMETRIAL ABLATION: SHX621

## 2018-08-22 DIAGNOSIS — Z23 Encounter for immunization: Secondary | ICD-10-CM | POA: Diagnosis not present

## 2018-09-01 DIAGNOSIS — Z1231 Encounter for screening mammogram for malignant neoplasm of breast: Secondary | ICD-10-CM | POA: Diagnosis not present

## 2018-09-24 DIAGNOSIS — N926 Irregular menstruation, unspecified: Secondary | ICD-10-CM | POA: Diagnosis not present

## 2018-09-24 DIAGNOSIS — N898 Other specified noninflammatory disorders of vagina: Secondary | ICD-10-CM | POA: Diagnosis not present

## 2018-09-29 ENCOUNTER — Telehealth: Payer: Self-pay | Admitting: Family Medicine

## 2018-09-29 NOTE — Telephone Encounter (Signed)
Error

## 2018-10-01 ENCOUNTER — Encounter: Payer: Self-pay | Admitting: Family Medicine

## 2018-10-01 ENCOUNTER — Ambulatory Visit (INDEPENDENT_AMBULATORY_CARE_PROVIDER_SITE_OTHER): Payer: 59 | Admitting: Family Medicine

## 2018-10-01 VITALS — BP 143/91 | HR 77 | Temp 98.3°F | Resp 16 | Ht 62.0 in | Wt 134.0 lb

## 2018-10-01 DIAGNOSIS — E663 Overweight: Secondary | ICD-10-CM

## 2018-10-01 DIAGNOSIS — Z1329 Encounter for screening for other suspected endocrine disorder: Secondary | ICD-10-CM

## 2018-10-01 DIAGNOSIS — Z131 Encounter for screening for diabetes mellitus: Secondary | ICD-10-CM | POA: Diagnosis not present

## 2018-10-01 DIAGNOSIS — Z124 Encounter for screening for malignant neoplasm of cervix: Secondary | ICD-10-CM | POA: Diagnosis not present

## 2018-10-01 DIAGNOSIS — Z0000 Encounter for general adult medical examination without abnormal findings: Secondary | ICD-10-CM | POA: Diagnosis not present

## 2018-10-01 DIAGNOSIS — Z1231 Encounter for screening mammogram for malignant neoplasm of breast: Secondary | ICD-10-CM | POA: Diagnosis not present

## 2018-10-01 DIAGNOSIS — Z136 Encounter for screening for cardiovascular disorders: Secondary | ICD-10-CM | POA: Diagnosis not present

## 2018-10-01 DIAGNOSIS — Z1389 Encounter for screening for other disorder: Secondary | ICD-10-CM

## 2018-10-01 DIAGNOSIS — R7303 Prediabetes: Secondary | ICD-10-CM

## 2018-10-01 DIAGNOSIS — Z1383 Encounter for screening for respiratory disorder NEC: Secondary | ICD-10-CM

## 2018-10-01 DIAGNOSIS — F5102 Adjustment insomnia: Secondary | ICD-10-CM

## 2018-10-01 DIAGNOSIS — Z1321 Encounter for screening for nutritional disorder: Secondary | ICD-10-CM

## 2018-10-01 DIAGNOSIS — F411 Generalized anxiety disorder: Secondary | ICD-10-CM

## 2018-10-01 DIAGNOSIS — E559 Vitamin D deficiency, unspecified: Secondary | ICD-10-CM

## 2018-10-01 DIAGNOSIS — D500 Iron deficiency anemia secondary to blood loss (chronic): Secondary | ICD-10-CM

## 2018-10-01 MED ORDER — ESZOPICLONE 3 MG PO TABS: 3.0000 mg | ORAL_TABLET | Freq: Every day | ORAL | 0 refills | Status: DC

## 2018-10-01 MED ORDER — SUVOREXANT 10 MG PO TABS: 1.0000 | ORAL_TABLET | Freq: Every day | ORAL | 5 refills | Status: DC

## 2018-10-01 MED ORDER — LORAZEPAM 0.5 MG PO TABS: 0.5000 mg | ORAL_TABLET | Freq: Every evening | ORAL | 5 refills | Status: AC | PRN

## 2018-10-01 MED ORDER — SUVOREXANT 10 MG PO TABS: 1.0000 | ORAL_TABLET | Freq: Every day | ORAL | 0 refills | Status: DC

## 2018-10-01 NOTE — Patient Instructions (Addendum)
If you have lab work done today you will be contacted with your lab results within the next 2 weeks.  If you have not heard from [place] then please contact us. The fastest way to get your results is to register for My Chart.   IF you received an x-ray today, you will receive an invoice from [facility]. Please contact [facility] at [phone] with questions or concerns regarding your invoice.   IF you received labwork today, you will receive an invoice from [facility]. Please contact [facility] at [phone] with questions or concerns regarding your invoice.   Our billing staff will not be able to assist you with questions regarding bills from these companies.  You will be contacted with the lab results as soon as they are available. The fastest way to get your results is to activate your My Chart account. Instructions are located on the last page of this paperwork. If you have not heard from [place] regarding the results in 2 weeks, please contact this office.    Insomnia Insomnia is a sleep disorder that makes it difficult to fall asleep or to stay asleep. Insomnia can cause tiredness (fatigue), low energy, difficulty concentrating, mood swings, and poor performance at work or school. There are three different ways to classify insomnia:  Difficulty falling asleep.  Difficulty staying asleep.  Waking up too early in the morning.  Any type of insomnia can be long-term (chronic) or short-term (acute). Both are common. Short-term insomnia usually lasts for three months or less. Chronic insomnia occurs at least three times a week for longer than three months. What are the causes? Insomnia may be caused by another condition, situation, or substance, such as:  Anxiety.  Certain medicines.  Gastroesophageal reflux disease (GERD) or other gastrointestinal conditions.  Asthma or other breathing conditions.  Restless legs syndrome, sleep apnea, or other sleep  disorders.  Chronic pain.  Menopause. This may include hot flashes.  Stroke.  Abuse of alcohol, tobacco, or illegal drugs.  Depression.  Caffeine.  Neurological disorders, such as Alzheimer disease.  An overactive thyroid (hyperthyroidism).  The cause of insomnia may not be known. What increases the risk? Risk factors for insomnia include:  Gender. Women are more commonly affected than men.  Age. Insomnia is more common as you get older.  Stress. This may involve your professional or personal life.  Income. Insomnia is more common in people with lower income.  Lack of exercise.  Irregular work schedule or night shifts.  Traveling between different time zones.  What are the signs or symptoms? If you have insomnia, trouble falling asleep or trouble staying asleep is the main symptom. This may lead to other symptoms, such as:  Feeling fatigued.  Feeling nervous about going to sleep.  Not feeling rested in the morning.  Having trouble concentrating.  Feeling irritable, anxious, or depressed.  How is this treated? Treatment for insomnia depends on the cause. If your insomnia is caused by an underlying condition, treatment will focus on addressing the condition. Treatment may also include:  Medicines to help you sleep.  Counseling or therapy.  Lifestyle adjustments.  Follow these instructions at home:  Take medicines only as directed by your health care provider.  Keep regular sleeping and waking hours. Avoid naps.  Keep a sleep diary to help you and your health care provider figure out what could be causing your insomnia. Include: ? When you sleep. ? When you wake up during the night. ? How  well you sleep. ? How rested you feel the next day. ? Any side effects of medicines you are taking. ? What you eat and drink.  Make your bedroom a comfortable place where it is easy to fall asleep: ? Put up shades or special blackout curtains to block light from  outside. ? Use a white noise machine to block noise. ? Keep the temperature cool.  Exercise regularly as directed by your health care provider. Avoid exercising right before bedtime.  Use relaxation techniques to manage stress. Ask your health care provider to suggest some techniques that may work well for you. These may include: ? Breathing exercises. ? Routines to release muscle tension. ? Visualizing peaceful scenes.  Cut back on alcohol, caffeinated beverages, and cigarettes, especially close to bedtime. These can disrupt your sleep.  Do not overeat or eat spicy foods right before bedtime. This can lead to digestive discomfort that can make it hard for you to sleep.  Limit screen use before bedtime. This includes: ? Watching TV. ? Using your smartphone, tablet, and computer.  Stick to a routine. This can help you fall asleep faster. Try to do a quiet activity, brush your teeth, and go to bed at the same time each night.  Get out of bed if you are still awake after 15 minutes of trying to sleep. Keep the lights down, but try reading or doing a quiet activity. When you feel sleepy, go back to bed.  Make sure that you drive carefully. Avoid driving if you feel very sleepy.  Keep all follow-up appointments as directed by your health care provider. This is important. Contact a health care provider if:  You are tired throughout the day or have trouble in your daily routine due to sleepiness.  You continue to have sleep problems or your sleep problems get worse. Get help right away if:  You have serious thoughts about hurting yourself or someone else. This information is not intended to replace advice given to you by your health care provider. Make sure you discuss any questions you have with your health care provider. Document Released: 10/31/2000 Document Revised: 04/04/2016 Document Reviewed: 08/04/2014 Elsevier Interactive Patient Education  [employer].

## 2018-10-03 ENCOUNTER — Encounter: Payer: Self-pay | Admitting: Family Medicine

## 2018-10-03 NOTE — Progress Notes (Signed)
Subjective:    Patient ID: Brandy Ramsey; female   DOB: 06-Apr-1971; 47 y.o.   MRN: [id number]  Chief Complaint  Patient presents with  . Annual Exam    pap, pt refused urinalysis    HPI Primary Preventative Screenings: Cervical Cancer: Seen by Dr. Raphael Gibney at [facility] but declined pap due to q3 yrs rec when her endometrial ablation was done, however, her insurance requires it to be done yearly to get preventative care discount. No prior h/o abnl - last done 07/2017 at [facility] ob[facility] nml Family Planning: na - perimenopausal STI screening: declines - no risk in married monogamous relationship Breast Cancer: Normal 08/2018 at [place]: NA due to age - will discuss next yr Tobacco use/EtOH/substances: none Bone Density: NA due to age Cardiac: none prior due to lack of risk factors Weight/Blood sugar/Diet/Exercise: health diet, exercises at home some BMI Readings from Last 3 Encounters:  10/01/18 24.51 kg/m  11/02/17 25.02 kg/m  02/09/17 25.32 kg/m   Lab Results  Component Value Date   HGBA1C 6.0 (H) 03/14/2016   OTC/Vit/Supp/Herbal:calcium carb and vit D 600-400 1 tab qd Dentist/Optho: both regularly q6 mo/q yr as directed. Immunizations:  Immunization History  Administered Date(s) Administered  . Hepatitis B 11/18/2003  . Influenza,inj,Quad PF,6+ Mos 07/31/2015  . Influenza-Unspecified 08/17/2013, 08/17/2016, 06/17/2017  . Tdap 11/06/2009     Chronic Medical Conditions: Insomnia/Anxiety: Still having problems sleeping unless she takes a Lorazepam 0.5 mg which works great but she uses it very sparingly due to her concerns over never wanting to be dependent upon med or become tolerant.  She was only able to try 10 days or so of the Belsomra 10 mg as her insurance would not cover it so she had to pay out of pocket about $4 per pill. They wanted her to be on a sedative-hypnotic Lorrin Mais, sonata, [place]) instead which she did not  want to try as had tried Ambien many years previously and only kept her asleep for 4 hours as well as her concerns over prolonged metabolism in women.  Failed hydroxyzine, melatonin, and trazodone.  No matter what time she goes to bed, she awakens about 2 AM and then cannot fall asleep until 5:30 AM again.  Insomnia's symptoms are getting worse since perimenopausal.  Though she is exercising regularly which helps.  Medical History: Past Medical History:  Diagnosis Date  . Anemia   . Gestational diabetes mellitus in pregnancy   . Thyroid enlargement   . Uterine fibroid 2013   multiple intramural and subserosal   No past surgical history on file. Current Outpatient Medications on File Prior to Visit  Medication Sig Dispense Refill  . Calcium Carb-Cholecalciferol 600-400 MG-UNIT TABS Take 2 tablets by mouth daily. 60 tablet   . ferrous fumarate (HEMOCYTE - 106 MG FE) 325 (106 Fe) MG TABS tablet Take 1 tablet (106 mg of iron total) by mouth daily. 30 each   . hydrOXYzine (ATARAX/VISTARIL) 25 MG tablet Take 0.5-1 tablets (12.5-25 mg total) by mouth at bedtime as needed and may repeat dose one time if needed (sleep). 30 tablet 3  . Melatonin 10 MG CAPS Take 10 mg by mouth at bedtime.    . Multiple Vitamin (MULTIVITAMIN) tablet Take 1 tablet by mouth daily.     No current facility-administered medications on file prior to visit.    No Known Allergies Family History  Problem Relation Age of Onset  . Hypertension Mother   . Diabetes Mother   .  Hypertension Father    Social History   Socioeconomic History  . Marital status: Married    Spouse name: Not on file  . Number of children: Not on file  . Years of education: Not on file  . Highest education level: Not on file  Occupational History  . Occupation: physician    Employer: [employer]  . Financial resource strain: Not on file  . Food insecurity:    Worry: Not on file    Inability: Not on file  . Transportation  needs:    Medical: Not on file    Non-medical: Not on file  Tobacco Use  . Smoking status: Never Smoker  . Smokeless tobacco: Never Used  Substance and Sexual Activity  . Alcohol use: No    Alcohol/week: 0.0 standard drinks  . Drug use: No  . Sexual activity: Yes    Birth control/protection: Condom    Comment: number of sex partners in the last 76 months 1  Lifestyle  . Physical activity:    Days per week: Not on file    Minutes per session: Not on file  . Stress: Not on file  Relationships  . Social connections:    Talks on phone: Not on file    Gets together: Not on file    Attends religious service: Not on file    Active member of club or organization: Not on file    Attends meetings of clubs or organizations: Not on file    Relationship status: Not on file  Other Topics Concern  . Not on file  Social History Narrative   Exercise on treadmill qod. Married. Education: Freight forwarder. Exercise: Yes.  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  Depression screen [facility] 2/9 02/09/2017 03/13/2016 03/09/2015 08/25/2014  Decreased Interest 0 0 0 1  Down, Depressed, Hopeless 0 0 0 1  PHQ - 2 Score 0 0 0 2  Altered sleeping - - - 1  Tired, decreased energy - - - 1  Change in appetite - - - 0  Feeling bad or failure about yourself  - - - 0  Trouble concentrating - - - 1  Moving slowly or fidgety/restless - - - 0  Suicidal thoughts - - - 0  PHQ-9 Score - - - 5     Review of Systems  Psychiatric/Behavioral: The patient has insomnia.   All other systems reviewed and are negative. Otherwise as noted in HPI.  Objective:  BP (!) 143/91 (BP Location: Right Arm, Patient Position: Sitting, Cuff Size: Normal)   Pulse 77   Temp 98.3 F (36.8 C) (Oral)   Resp 16   Ht 5\' 2"  (1.575 m)   Wt 134 lb (60.8 kg)   SpO2 100%   BMI 24.51 kg/m  Vision Screening Comments: PT REFUSED EYE EXam. Physical Exam  Constitutional: She is oriented to person, place,  and time. She appears well-developed and well-nourished. No distress.  HENT:  Head: Normocephalic and atraumatic.  Right Ear: Tympanic membrane, external ear and ear canal normal.  Left Ear: Tympanic membrane, external ear and ear canal normal.  Nose: Mucosal edema present. No rhinorrhea.  Mouth/Throat: Uvula is midline, oropharynx is clear and moist and mucous membranes are normal. No posterior oropharyngeal erythema.  Eyes: Pupils are equal, round, and reactive to light. Conjunctivae and EOM are normal. Right eye exhibits no discharge. Left eye exhibits no discharge. No scleral icterus.  Neck: Normal range of  motion. Neck supple. No thyromegaly present.  Cardiovascular: Normal rate, regular rhythm, normal heart sounds and intact distal pulses.  Pulmonary/Chest: Effort normal and breath sounds normal. No respiratory distress.  Abdominal: Soft. Bowel sounds are normal. There is no tenderness.  Genitourinary: Vagina normal and uterus normal. No breast tenderness, discharge or bleeding. Cervix exhibits no motion tenderness and no friability. Right adnexum displays no mass and no tenderness. Left adnexum displays no mass and no tenderness.  Musculoskeletal: She exhibits no edema.  Lymphadenopathy:    She has no cervical adenopathy.  Neurological: She is alert and oriented to person, place, and time. She has normal reflexes.  Skin: Skin is warm and dry. She is not diaphoretic. No erythema.  Psychiatric: She has a normal mood and affect. Her behavior is normal.    POC TESTING none   Assessment & Plan:   1. Annual physical exam   2. Encounter for screening mammogram for breast cancer   3. Screening for cardiovascular, respiratory, and genitourinary diseases   4. Iron deficiency anemia due to chronic blood loss   5. Insomnia due to stress - did well on belsomra but to expensive. Failed ambien (only kept asleep x 4 hrs) so try lunesta as longer acting - if doesn't work - call for belsomra 10mg   rx to be sent in then we can try to fill out PA since will have failed BOTH lunesta and ambien and prev tried on hydroxyzine, lorazepam, trazodone, melatonin. Cont prn lorazepam which worse very well for her sleep but wants to use VERY sparingly due to tolerance/dependence effects of BZD.   6. Anxiety state   7. Overweight (BMI 25.0-29.9)   8. Vitamin D deficiency   9. Prediabetes   10. Screening for diabetes mellitus   11. Encounter for vitamin deficiency screening   12. Screening for cervical cancer   13. Screening for thyroid disorder     Patient will continue on current chronic medications other than changes noted above, so ok to refill when needed.   Reviewed all health maintenance recommendations per USPSTF guidelines.   See after visit summary for patient specific instructions.  Orders Placed This Encounter  Procedures  . TSH  . CBC with Differential/Platelet  . Comprehensive metabolic panel    Order Specific Question:   Has the patient fasted?    Answer:   Yes  . Lipid panel    Order Specific Question:   Has the patient fasted?    Answer:   Yes  . VITAMIN D 25 Hydroxy (Vit-D Deficiency, Fractures)  . Hemoglobin A1c    Meds ordered this encounter  Medications  . LORazepam (ATIVAN) 0.5 MG tablet    Sig: Take 1 tablet (0.5 mg total) by mouth at bedtime as needed for sleep.    Dispense:  30 tablet    Refill:  5  . DISCONTD: Suvorexant (BELSOMRA) 10 MG TABS    Sig: Take 1 tablet by mouth at bedtime.    Dispense:  30 tablet    Refill:  0  . Eszopiclone 3 MG TABS    Sig: Take 1 tablet (3 mg total) by mouth at bedtime. Take immediately before bedtime    Dispense:  30 tablet    Refill:  0  . Suvorexant (BELSOMRA) 10 MG TABS    Sig: Take 1 tablet by mouth at bedtime.    Dispense:  30 tablet    Refill:  5    Patient verbalized to me that they understand the following:  diagnosis, what is being done for them, what to expect and what should be done at home.  Their  questions have been answered. They understand that I am unable to predict every possible medication interaction or adverse outcome and that if any unexpected symptoms arise, they should contact us and their pharmacist, as well as never hesitate to seek urgent/emergent care at [facility] or ER if they think it might be warranted.    Delman Cheadle, MD, MPH [facility] [address] [place], [place]  [address] [phone] Office phone  [phone] Office fax   10/03/18 3:01 PM

## 2018-10-04 ENCOUNTER — Ambulatory Visit (INDEPENDENT_AMBULATORY_CARE_PROVIDER_SITE_OTHER): Payer: 59 | Admitting: Family Medicine

## 2018-10-04 DIAGNOSIS — R7989 Other specified abnormal findings of blood chemistry: Secondary | ICD-10-CM

## 2018-10-04 DIAGNOSIS — Z0000 Encounter for general adult medical examination without abnormal findings: Secondary | ICD-10-CM

## 2018-10-04 DIAGNOSIS — Z1383 Encounter for screening for respiratory disorder NEC: Secondary | ICD-10-CM

## 2018-10-04 DIAGNOSIS — Z136 Encounter for screening for cardiovascular disorders: Secondary | ICD-10-CM | POA: Diagnosis not present

## 2018-10-04 DIAGNOSIS — Z131 Encounter for screening for diabetes mellitus: Secondary | ICD-10-CM

## 2018-10-04 DIAGNOSIS — Z1389 Encounter for screening for other disorder: Secondary | ICD-10-CM

## 2018-10-04 DIAGNOSIS — Z1321 Encounter for screening for nutritional disorder: Secondary | ICD-10-CM

## 2018-10-04 DIAGNOSIS — Z1329 Encounter for screening for other suspected endocrine disorder: Secondary | ICD-10-CM

## 2018-10-04 DIAGNOSIS — R5383 Other fatigue: Secondary | ICD-10-CM

## 2018-10-04 LAB — PAP IG W/ RFLX HPV ASCU

## 2018-10-05 LAB — TSH: TSH: 5.33 u[IU]/mL — AB (ref 0.450–4.500)

## 2018-10-05 LAB — LIPID PANEL
CHOL/HDL RATIO: 2.6 ratio (ref 0.0–4.4)
Cholesterol, Total: 190 mg/dL (ref 100–199)
HDL: 74 mg/dL (ref >39)
LDL Calculated: 98 mg/dL (ref 0–99)
Triglycerides: 88 mg/dL (ref 0–149)
VLDL CHOLESTEROL CAL: 18 mg/dL (ref 5–40)

## 2018-10-05 LAB — CBC WITH DIFFERENTIAL/PLATELET
BASOS ABS: 0.1 10*3/uL (ref 0.0–0.2)
Basos: 1 %
EOS (ABSOLUTE): 0.1 10*3/uL (ref 0.0–0.4)
Eos: 2 %
HEMOGLOBIN: 12.8 g/dL (ref 11.1–15.9)
Hematocrit: 40.7 % (ref 34.0–46.6)
Immature Grans (Abs): 0 10*3/uL (ref 0.0–0.1)
Immature Granulocytes: 0 %
LYMPHS ABS: 1.5 10*3/uL (ref 0.7–3.1)
Lymphs: 28 %
MCH: 26.2 pg — AB (ref 26.6–33.0)
MCHC: 31.4 g/dL — AB (ref 31.5–35.7)
MCV: 83 fL (ref 79–97)
MONOCYTES: 6 %
MONOS ABS: 0.3 10*3/uL (ref 0.1–0.9)
NEUTROS ABS: 3.3 10*3/uL (ref 1.4–7.0)
Neutrophils: 63 %
Platelets: 384 10*3/uL (ref 150–450)
RBC: 4.88 x10E6/uL (ref 3.77–5.28)
RDW: 13.3 % (ref 12.3–15.4)
WBC: 5.3 10*3/uL (ref 3.4–10.8)

## 2018-10-05 LAB — COMPREHENSIVE METABOLIC PANEL
ALBUMIN: 4.5 g/dL (ref 3.5–5.5)
ALK PHOS: 68 IU/L (ref 39–117)
ALT: 20 IU/L (ref 0–32)
AST: 16 IU/L (ref 0–40)
Albumin/Globulin Ratio: 1.7 (ref 1.2–2.2)
BILIRUBIN TOTAL: 0.2 mg/dL (ref 0.0–1.2)
BUN / CREAT RATIO: 23 (ref 9–23)
BUN: 14 mg/dL (ref 6–24)
CO2: 21 mmol/L (ref 20–29)
Calcium: 9.6 mg/dL (ref 8.7–10.2)
Chloride: 104 mmol/L (ref 96–106)
Creatinine, Ser: 0.6 mg/dL (ref 0.57–1.00)
GFR calc Af Amer: 124 mL/min/{1.73_m2} (ref >59)
GFR calc non Af Amer: 107 mL/min/{1.73_m2} (ref >59)
GLUCOSE: 96 mg/dL (ref 65–99)
Globulin, Total: 2.6 g/dL (ref 1.5–4.5)
Potassium: 4.5 mmol/L (ref 3.5–5.2)
Sodium: 140 mmol/L (ref 134–144)
Total Protein: 7.1 g/dL (ref 6.0–8.5)

## 2018-10-05 LAB — HEMOGLOBIN A1C
ESTIMATED AVERAGE GLUCOSE: 120 mg/dL
HEMOGLOBIN A1C: 5.8 % — AB (ref 4.8–5.6)

## 2018-10-05 LAB — VITAMIN D 25 HYDROXY (VIT D DEFICIENCY, FRACTURES): Vit D, 25-Hydroxy: 33.2 ng/mL (ref 30.0–100.0)

## 2018-10-16 NOTE — Progress Notes (Signed)
Lab only visit. Not seen by provider.

## 2018-10-20 ENCOUNTER — Encounter: Payer: BLUE CROSS/BLUE SHIELD | Admitting: Family Medicine

## 2018-10-29 ENCOUNTER — Encounter: Payer: BLUE CROSS/BLUE SHIELD | Admitting: Family Medicine

## 2018-11-19 ENCOUNTER — Ambulatory Visit: Payer: 59 | Admitting: Emergency Medicine

## 2018-11-19 DIAGNOSIS — R5383 Other fatigue: Secondary | ICD-10-CM | POA: Diagnosis not present

## 2018-11-19 DIAGNOSIS — R7989 Other specified abnormal findings of blood chemistry: Secondary | ICD-10-CM | POA: Diagnosis not present

## 2018-11-20 LAB — TSH+T4F+T3FREE
FREE T4: 1.37 ng/dL (ref 0.82–1.77)
T3, Free: 2.6 pg/mL (ref 2.0–4.4)
TSH: 3.36 u[IU]/mL (ref 0.450–4.500)

## 2018-12-13 ENCOUNTER — Encounter: Payer: Self-pay | Admitting: Family Medicine

## 2018-12-16 ENCOUNTER — Telehealth: Payer: Self-pay | Admitting: Family Medicine

## 2018-12-16 NOTE — Telephone Encounter (Signed)
Please start PA You will need to ask from patient what happened with lunesta  See Dr Raul Del OV for physical done on Nov 2019 I would also mention to patient that there is a saving card for belsomra, which she might want to try. thanks

## 2018-12-16 NOTE — Telephone Encounter (Signed)
Patient was denied the medication Belsomra by her insurance company. Letter states that she needs to try Zolpidem or Zaleplon first before they will approve the Belsomra. Denial letter placed in the providers box at nurses station.

## 2018-12-17 NOTE — Telephone Encounter (Signed)
Patient is calling to check the status of the PA for the medication  Belsomra please advise

## 2018-12-22 NOTE — Telephone Encounter (Signed)
Please advise 

## 2019-09-14 ENCOUNTER — Other Ambulatory Visit: Payer: Self-pay | Admitting: Family Medicine

## 2019-09-14 DIAGNOSIS — E0590 Thyrotoxicosis, unspecified without thyrotoxic crisis or storm: Secondary | ICD-10-CM

## 2019-09-15 ENCOUNTER — Other Ambulatory Visit: Payer: Managed Care, Other (non HMO)

## 2019-09-19 ENCOUNTER — Other Ambulatory Visit (HOSPITAL_COMMUNITY): Payer: Self-pay | Admitting: Internal Medicine

## 2019-09-19 DIAGNOSIS — E0590 Thyrotoxicosis, unspecified without thyrotoxic crisis or storm: Secondary | ICD-10-CM

## 2019-09-29 ENCOUNTER — Other Ambulatory Visit (HOSPITAL_COMMUNITY): Payer: Self-pay

## 2019-09-29 ENCOUNTER — Encounter (HOSPITAL_COMMUNITY): Payer: Self-pay

## 2019-09-30 ENCOUNTER — Other Ambulatory Visit (HOSPITAL_COMMUNITY): Payer: Self-pay

## 2019-09-30 ENCOUNTER — Encounter (HOSPITAL_COMMUNITY): Payer: Self-pay

## 2019-10-04 ENCOUNTER — Encounter (HOSPITAL_COMMUNITY): Payer: 59

## 2019-10-05 ENCOUNTER — Encounter (HOSPITAL_COMMUNITY): Payer: 59

## 2019-10-18 ENCOUNTER — Encounter (HOSPITAL_COMMUNITY)
Admission: RE | Admit: 2019-10-18 | Discharge: 2019-10-18 | Disposition: A | Discharge status: Home / Self Care | Payer: 59 | Source: Ambulatory Visit | Attending: Internal Medicine | Admitting: Internal Medicine

## 2019-10-18 ENCOUNTER — Other Ambulatory Visit: Payer: Self-pay

## 2019-10-18 DIAGNOSIS — E0590 Thyrotoxicosis, unspecified without thyrotoxic crisis or storm: Secondary | ICD-10-CM | POA: Insufficient documentation

## 2019-10-18 MED ORDER — SODIUM IODIDE I-123 7.4 MBQ CAPS
454.0000 | ORAL_CAPSULE | Freq: Once | ORAL | Status: AC
  Administered 2019-10-18: 454 via ORAL

## 2019-10-19 ENCOUNTER — Encounter (HOSPITAL_COMMUNITY)
Admission: RE | Admit: 2019-10-19 | Discharge: 2019-10-19 | Disposition: A | Discharge status: Home / Self Care | Payer: 59 | Source: Ambulatory Visit | Attending: Internal Medicine | Admitting: Internal Medicine

## 2020-03-26 ENCOUNTER — Ambulatory Visit: Payer: 59 | Admitting: Physical Therapy

## 2020-05-15 ENCOUNTER — Other Ambulatory Visit: Payer: Self-pay

## 2020-08-16 ENCOUNTER — Encounter: Payer: Self-pay | Admitting: Family Medicine

## 2020-08-16 ENCOUNTER — Ambulatory Visit (INDEPENDENT_AMBULATORY_CARE_PROVIDER_SITE_OTHER): Payer: 59 | Admitting: Family Medicine

## 2020-08-16 ENCOUNTER — Ambulatory Visit: Payer: Self-pay

## 2020-08-16 ENCOUNTER — Other Ambulatory Visit: Payer: Self-pay

## 2020-08-16 VITALS — BP 118/84 | HR 87 | Ht 62.0 in | Wt 134.2 lb

## 2020-08-16 DIAGNOSIS — E119 Type 2 diabetes mellitus without complications: Secondary | ICD-10-CM | POA: Insufficient documentation

## 2020-08-16 DIAGNOSIS — K9049 Malabsorption due to intolerance, not elsewhere classified: Secondary | ICD-10-CM | POA: Insufficient documentation

## 2020-08-16 DIAGNOSIS — N841 Polyp of cervix uteri: Secondary | ICD-10-CM | POA: Insufficient documentation

## 2020-08-16 DIAGNOSIS — N926 Irregular menstruation, unspecified: Secondary | ICD-10-CM | POA: Insufficient documentation

## 2020-08-16 DIAGNOSIS — I10 Essential (primary) hypertension: Secondary | ICD-10-CM | POA: Insufficient documentation

## 2020-08-16 DIAGNOSIS — M722 Plantar fascial fibromatosis: Secondary | ICD-10-CM | POA: Diagnosis not present

## 2020-08-16 NOTE — Patient Instructions (Addendum)
Thank you for coming in today.  Do the eccentric heel exercises 30 reps 2-3x daily.  Remember to go from up to down slowly.   Arch straps/arch binder may be helpful.   Gel heel cups or insoles with cushioning may be helpful.   Ice massage in the evening.   Please use voltaren gel up to 4x daily for pain as needed.   Try night splint  Recommend recheck in about 6 weeks.

## 2020-08-16 NOTE — Progress Notes (Signed)
    Subjective:    CC: B plantar foot pain, R>L  I, Molly Weber, LAT, ATC, am serving as scribe for Dr. Lynne Leader.  HPI: Pt is a 49 y/o female presenting w/ c/o B plantar foot pain, R>L, for a few months.  She specifically locates her pain to her heel but will have radiating pain along the plantar surface of her foot.  She works as a Ecologist.  She is actively trying to lose weight in an effort to improve her hypertension and diabetes and cholesterol.  She is unable to do a lot of exercise walking because of the heel pain.  Radiating pain: yes along her B plantar feet Aggravating factors: walking and prolonged standing Treatments tried: naproxen; stretching  Pertinent review of Systems: No fevers or chills  Relevant historical information: Diabetes hypertension   Objective:    Vitals:   08/16/20 0939  BP: 118/84  Pulse: 87  SpO2: 98%   General: Well Developed, well nourished, and in no acute distress.   MSK: Feet bilaterally have inward curving hindfeet with slight cavus foot appearance otherwise normal-appearing Normal foot and ankle motion bilaterally. Tender palpation medial plantar calcaneus bilaterally. Pulses cap refill and sensation are intact bilaterally. Leg lengths equal.  Lab and Radiology Results  Diagnostic Limited MSK Ultrasound of: Bilateral plantar fascia Thickened appearance plantar fascia at insertion onto medial plantar calcaneus with hyperechoic changes at insertion site consistent with plantar fasciitis.  Right foot subcutaneous tissue has hypoechoic fluid tracking within subcutaneous fat. Impression: Plantar fasciitis bilaterally     Impression and Recommendations:    Assessment and Plan: 49 y.o. female with bilateral plantar fasciitis.  Patient has a good shape that is a bit of a risk factor for this.  Discussed options.  Plan to maximize conservative management with arch straps eccentric  exercises gel heel cups and trial of Voltaren gel.  Also recommend trial of night splint.  Recheck in 6 weeks.  If not better consider injections or other more aggressive management.  Would like to avoid steroid injections and possible.Marland Kitchen  PDMP not reviewed this encounter. Orders Placed This Encounter  Procedures  . [place] LIMITED JOINT SPACE STRUCTURES LOW BILAT(NO LINKED CHARGES)    Order Specific Question:   Reason for Exam (SYMPTOM  OR DIAGNOSIS REQUIRED)    Answer:   B plantar fasciitis    Order Specific Question:   Preferred imaging location?    Answer:   [place]   No orders of the defined types were placed in this encounter.   Discussed warning signs or symptoms. Please see discharge instructions. Patient expresses understanding.   The above documentation has been reviewed and is accurate and complete Lynne Leader, M.D.

## 2020-09-27 ENCOUNTER — Ambulatory Visit: Payer: 59 | Admitting: Family Medicine

## 2021-07-15 ENCOUNTER — Other Ambulatory Visit: Payer: Self-pay | Admitting: Gastroenterology

## 2021-07-15 ENCOUNTER — Other Ambulatory Visit (HOSPITAL_COMMUNITY): Payer: Self-pay | Admitting: Family Medicine

## 2021-07-15 DIAGNOSIS — R7989 Other specified abnormal findings of blood chemistry: Secondary | ICD-10-CM

## 2021-07-18 ENCOUNTER — Ambulatory Visit
Admission: RE | Admit: 2021-07-18 | Discharge: 2021-07-18 | Disposition: A | Discharge status: Home / Self Care | Payer: 59 | Source: Ambulatory Visit | Attending: Gastroenterology | Admitting: Gastroenterology

## 2021-07-18 DIAGNOSIS — R7989 Other specified abnormal findings of blood chemistry: Secondary | ICD-10-CM

## 2021-07-30 ENCOUNTER — Ambulatory Visit (HOSPITAL_COMMUNITY)
Admission: RE | Admit: 2021-07-30 | Discharge: 2021-07-30 | Disposition: A | Discharge status: Home / Self Care | Payer: Self-pay | Source: Ambulatory Visit | Attending: Family Medicine | Admitting: Family Medicine

## 2021-07-30 ENCOUNTER — Other Ambulatory Visit: Payer: Self-pay

## 2021-07-30 DIAGNOSIS — K760 Fatty (change of) liver, not elsewhere classified: Secondary | ICD-10-CM | POA: Insufficient documentation

## 2021-07-30 DIAGNOSIS — R918 Other nonspecific abnormal finding of lung field: Secondary | ICD-10-CM | POA: Insufficient documentation

## 2022-08-05 ENCOUNTER — Other Ambulatory Visit: Payer: Self-pay | Admitting: Internal Medicine

## 2022-08-05 DIAGNOSIS — Z1231 Encounter for screening mammogram for malignant neoplasm of breast: Secondary | ICD-10-CM

## 2022-08-20 ENCOUNTER — Other Ambulatory Visit: Payer: Self-pay | Admitting: Gastroenterology

## 2022-08-20 DIAGNOSIS — R7989 Other specified abnormal findings of blood chemistry: Secondary | ICD-10-CM

## 2022-08-27 ENCOUNTER — Ambulatory Visit
Admission: RE | Admit: 2022-08-27 | Discharge: 2022-08-27 | Disposition: A | Discharge status: Home / Self Care | Payer: Commercial Managed Care - PPO | Source: Ambulatory Visit | Attending: Gastroenterology | Admitting: Gastroenterology

## 2022-08-27 DIAGNOSIS — R7989 Other specified abnormal findings of blood chemistry: Secondary | ICD-10-CM

## 2022-09-03 ENCOUNTER — Ambulatory Visit: Payer: 59

## 2022-09-17 ENCOUNTER — Ambulatory Visit
Admission: RE | Admit: 2022-09-17 | Discharge: 2022-09-17 | Disposition: A | Discharge status: Home / Self Care | Payer: Commercial Managed Care - PPO | Source: Ambulatory Visit | Attending: Internal Medicine | Admitting: Internal Medicine

## 2022-09-17 DIAGNOSIS — Z1231 Encounter for screening mammogram for malignant neoplasm of breast: Secondary | ICD-10-CM

## 2022-10-22 ENCOUNTER — Ambulatory Visit (HOSPITAL_COMMUNITY)
Admission: RE | Admit: 2022-10-22 | Discharge: 2022-10-22 | Disposition: A | Discharge status: Home / Self Care | Payer: Commercial Managed Care - PPO | Attending: Gastroenterology | Admitting: Gastroenterology

## 2022-10-22 ENCOUNTER — Encounter (HOSPITAL_COMMUNITY)
Admission: RE | Disposition: A | Discharge status: Home / Self Care | Payer: Self-pay | Source: Home / Self Care | Attending: Gastroenterology

## 2022-10-22 ENCOUNTER — Encounter (HOSPITAL_COMMUNITY): Payer: Self-pay | Admitting: Gastroenterology

## 2022-10-22 DIAGNOSIS — D649 Anemia, unspecified: Secondary | ICD-10-CM | POA: Insufficient documentation

## 2022-10-22 HISTORY — PX: GIVENS CAPSULE STUDY: SHX5432

## 2022-10-22 SURGERY — IMAGING PROCEDURE, GI TRACT, INTRALUMINAL, VIA CAPSULE

## 2022-10-22 SURGICAL SUPPLY — 1 items: TOWEL COTTON PACK 4EA (MISCELLANEOUS) ×4 IMPLANT

## 2022-10-22 NOTE — Discharge Instructions (Signed)
    [facility] ENDOSCOPY [address] [place], [place]  [address] Phone:  [phone]   Patient: Brandy Ramsey  Date of Birth: Jun 21, 1971  Date of Visit: 10/14/2022   To Whom It May Concern:  Sharilynn Cassity was seen and treated on 10/14/2022 and    .           Sincerely,     Treatment Team:  Attending Provider: Juanita Craver, MD

## 2022-10-22 NOTE — Progress Notes (Signed)
Patient successfully swallowed Givens capsule at [phone]; patient given diet instructions and when to return pill equipment. Verbalized understanding.

## 2022-10-26 ENCOUNTER — Encounter (HOSPITAL_COMMUNITY): Payer: Self-pay | Admitting: Gastroenterology

## 2023-08-13 ENCOUNTER — Other Ambulatory Visit: Payer: Self-pay | Admitting: Internal Medicine

## 2023-08-13 DIAGNOSIS — N6323 Unspecified lump in the left breast, lower outer quadrant: Secondary | ICD-10-CM

## 2023-08-13 DIAGNOSIS — K760 Fatty (change of) liver, not elsewhere classified: Secondary | ICD-10-CM

## 2023-08-27 ENCOUNTER — Ambulatory Visit
Admission: RE | Admit: 2023-08-27 | Discharge: 2023-08-27 | Disposition: A | Discharge status: Home / Self Care | Payer: Commercial Managed Care - PPO | Source: Ambulatory Visit | Attending: Internal Medicine | Admitting: Internal Medicine

## 2023-08-27 DIAGNOSIS — K760 Fatty (change of) liver, not elsewhere classified: Secondary | ICD-10-CM

## 2023-09-04 ENCOUNTER — Other Ambulatory Visit: Payer: Self-pay | Admitting: Internal Medicine

## 2023-09-04 ENCOUNTER — Ambulatory Visit
Admission: RE | Admit: 2023-09-04 | Discharge: 2023-09-04 | Disposition: A | Discharge status: Home / Self Care | Payer: Commercial Managed Care - PPO | Source: Ambulatory Visit | Attending: Internal Medicine | Admitting: Internal Medicine

## 2023-09-04 DIAGNOSIS — N6323 Unspecified lump in the left breast, lower outer quadrant: Secondary | ICD-10-CM

## 2024-05-12 ENCOUNTER — Encounter: Payer: Self-pay | Admitting: Gastroenterology

## 2024-05-12 ENCOUNTER — Other Ambulatory Visit: Payer: Self-pay | Admitting: Gastroenterology

## 2024-05-12 DIAGNOSIS — K7460 Unspecified cirrhosis of liver: Secondary | ICD-10-CM

## 2024-05-26 ENCOUNTER — Ambulatory Visit
Admission: RE | Admit: 2024-05-26 | Discharge: 2024-05-26 | Disposition: A | Discharge status: Home / Self Care | Source: Ambulatory Visit | Attending: Gastroenterology | Admitting: Gastroenterology

## 2024-05-26 DIAGNOSIS — K7460 Unspecified cirrhosis of liver: Secondary | ICD-10-CM

## 2024-06-06 ENCOUNTER — Other Ambulatory Visit: Payer: Self-pay | Admitting: Gastroenterology

## 2024-06-06 DIAGNOSIS — K7460 Unspecified cirrhosis of liver: Secondary | ICD-10-CM

## 2024-06-06 DIAGNOSIS — R945 Abnormal results of liver function studies: Secondary | ICD-10-CM

## 2024-07-08 ENCOUNTER — Ambulatory Visit
Admission: RE | Admit: 2024-07-08 | Discharge: 2024-07-08 | Disposition: A | Discharge status: Home / Self Care | Source: Ambulatory Visit | Attending: Gastroenterology | Admitting: Gastroenterology

## 2024-07-08 DIAGNOSIS — R945 Abnormal results of liver function studies: Secondary | ICD-10-CM

## 2024-07-08 DIAGNOSIS — G3189 Other specified degenerative diseases of nervous system: Secondary | ICD-10-CM

## 2024-07-08 MED ORDER — GADOPICLENOL 0.5 MMOL/ML IV SOLN
5.0000 mL | Freq: Once | INTRAVENOUS | Status: AC | PRN
  Administered 2024-07-08: 5 mL via INTRAVENOUS

## 2024-08-04 ENCOUNTER — Other Ambulatory Visit (INDEPENDENT_AMBULATORY_CARE_PROVIDER_SITE_OTHER): Payer: Self-pay

## 2024-08-04 ENCOUNTER — Ambulatory Visit (INDEPENDENT_AMBULATORY_CARE_PROVIDER_SITE_OTHER): Admitting: Orthopedic Surgery

## 2024-08-04 DIAGNOSIS — M25571 Pain in right ankle and joints of right foot: Secondary | ICD-10-CM | POA: Diagnosis not present

## 2024-08-07 ENCOUNTER — Encounter: Payer: Self-pay | Admitting: Orthopedic Surgery

## 2024-08-07 NOTE — Progress Notes (Signed)
 Office Visit Note   Patient: Brandy Ramsey           Date of Birth: 12-Sep-1971           MRN: [id number] Visit Date: 08/04/2024              Requested by: Vernon Velna SAUNDERS, MD [address] [place],  [place] [address] PCP: Vernon Velna SAUNDERS, MD  Chief Complaint  Patient presents with   Right Ankle - Pain      HPI: Discussed the use of AI scribe software for clinical note transcription with the patient, who gave verbal consent to proceed.  History of Present Illness Brandy Ramsey is a 53 year old female who presents with right ankle pain and instability.  She experiences pain in her right ankle, particularly after walking for exercise, which she does daily for 45 minutes to an hour. The pain is located on the heel and the entire side of the foot. She describes a tilting inward of the right foot compared to the left, which is evident in the wear pattern of her shoes. She has had to discard shoes due to excessive wear on the medial side.  No pain during casual walking, but significant discomfort occurs after exercise. The pain is exacerbated by standing on her toes. No current hip or knee arthritis is reported.  Her father had a similar issue later in life, requiring custom-made shoes due to a pelvic tilt. She is concerned about developing similar problems as she ages.     Assessment & Plan: Visit Diagnoses:  1. Pain in right ankle and joints of right foot     Plan: Assessment and Plan Assessment & Plan Right cavovarus foot with plantar flexed first ray and associated pain Right cavovarus foot with plantar flexed first ray causing pain and increased stress on the calcaneus. Surgical intervention deemed too invasive due to risks. - Recommend cork sole orthotics with met pad from [employer] or online. - Instruct to cut orthotic above first metatarsal head to reduce heel pressure. - Consider felt pad along lateral side if initial modification insufficient. -  Advise orthotic use during work and regular walking. - Follow up to assess orthotic effectiveness and adjust as needed.      Follow-Up Instructions: No follow-ups on file.   Ortho Exam  Patient is alert, oriented, no adenopathy, well-dressed, normal affect, normal respiratory effort. Physical Exam MUSCULOSKELETAL: Cavovarus calcaneus bilaterally, worse on right. Plantar flexed first ray with cavovarus foot and callus beneath first metatarsal head. Good pulses. Wearing on medial border of right sneaker. Ankle and subtalar range of motion good. Iliac crests level, no pelvic obliquity.      Imaging: No results found. No images are attached to the encounter.  Labs: Lab Results  Component Value Date   HGBA1C 5.8 (H) 10/04/2018   HGBA1C 6.0 (H) 03/14/2016   HGBA1C 6.2 (H) 03/15/2015     Lab Results  Component Value Date   ALBUMIN 4.5 10/04/2018   ALBUMIN 4.7 03/14/2016   ALBUMIN 4.2 03/15/2015    No results found for: MG Lab Results  Component Value Date   VD25OH 33.2 10/04/2018   VD25OH 25 (L) 03/14/2016    No results found for: PREALBUMIN    Latest Ref Rng & Units 10/04/2018    9:46 AM 03/14/2016    9:08 AM 03/15/2015    8:40 AM  CBC EXTENDED  WBC 3.4 - 10.8 x10E3/uL 5.3  6.0  6.5  RBC 3.77 - 5.28 x10E6/uL 4.88  4.83  4.52   Hemoglobin 11.1 - 15.9 g/dL 87.1  87.3  88.0   HCT 34.0 - 46.6 % 40.7  39.1  36.5   Platelets 150 - 450 x10E3/uL 384  383  371   NEUT# 1.4 - 7.0 x10E3/uL 3.3   4.1   Lymph# 0.7 - 3.1 x10E3/uL 1.5   1.8      There is no height or weight on file to calculate BMI.  Orders:  Orders Placed This Encounter  Procedures   XR Ankle 2 Views Right   No orders of the defined types were placed in this encounter.    Procedures: No procedures performed  Clinical Data: No additional findings.  ROS:  All other systems negative, except as noted in the HPI. Review of Systems  Objective: Vital Signs: There were no vitals taken for this  visit.  Specialty Comments:  No specialty comments available.  PMFS History: Patient Active Problem List   Diagnosis Date Noted   Diabetes (HCC) 08/16/2020   HTN (hypertension), benign 08/16/2020   Syndrome of carbohydrate intolerance 08/16/2020   Polyp of cervix 08/16/2020   Irregular periods 08/16/2020   Plantar fascial fibromatosis of both feet 08/16/2020   Vitamin D  deficiency 11/08/2017   Insomnia due to stress 02/09/2017   Overweight (BMI 25.0-29.9) 01/03/2014   Anxiety state 01/03/2014   Prediabetes 12/03/2012   Fe deficiency anemia 12/08/2011   Past Medical History:  Diagnosis Date   Anemia    Gestational diabetes mellitus in pregnancy    Thyroid  enlargement    Uterine fibroid 2013   multiple intramural and subserosal    Family History  Problem Relation Age of Onset   Hypertension Mother    Diabetes Mother    Hyperlipidemia Mother    Hypertension Father     Past Surgical History:  Procedure Laterality Date   ENDOMETRIAL ABLATION  08/20/2018   Dr. 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

## 2024-08-11 ENCOUNTER — Encounter: Payer: Self-pay | Admitting: Orthopedic Surgery

## 2024-09-02 ENCOUNTER — Other Ambulatory Visit: Payer: Self-pay | Admitting: Internal Medicine

## 2024-09-02 DIAGNOSIS — Z1231 Encounter for screening mammogram for malignant neoplasm of breast: Secondary | ICD-10-CM

## 2024-09-12 ENCOUNTER — Encounter: Payer: Self-pay | Admitting: Internal Medicine

## 2024-09-12 ENCOUNTER — Other Ambulatory Visit (HOSPITAL_COMMUNITY): Payer: Self-pay | Admitting: Internal Medicine

## 2024-09-12 DIAGNOSIS — E611 Iron deficiency: Secondary | ICD-10-CM | POA: Insufficient documentation

## 2024-09-12 DIAGNOSIS — D509 Iron deficiency anemia, unspecified: Secondary | ICD-10-CM | POA: Insufficient documentation

## 2024-09-14 ENCOUNTER — Telehealth: Payer: Self-pay

## 2024-09-14 NOTE — Telephone Encounter (Signed)
 Dr. Vernon, patient will be scheduled as soon as possible.  Auth Submission: NO AUTH NEEDED Site of care: Site of care: [facility] Payer: [employer] commercial Medication & CPT/J Code(s) submitted: Feraheme (ferumoxytol) [id number] Diagnosis Code:  Route of submission (phone, fax, portal): phone Phone # [phone] Fax # Auth type: Buy/Bill PB Units/visits requested: 510mg  x 2 doses Reference number: [id number] Approval from: 09/14/24 to 11/16/24

## 2024-09-19 ENCOUNTER — Encounter: Payer: Self-pay | Admitting: Radiology

## 2024-09-23 ENCOUNTER — Ambulatory Visit

## 2024-09-29 ENCOUNTER — Ambulatory Visit
Admission: RE | Admit: 2024-09-29 | Discharge: 2024-09-29 | Disposition: A | Source: Ambulatory Visit | Attending: Internal Medicine

## 2024-09-29 DIAGNOSIS — Z1231 Encounter for screening mammogram for malignant neoplasm of breast: Secondary | ICD-10-CM

## 2024-09-30 ENCOUNTER — Ambulatory Visit (INDEPENDENT_AMBULATORY_CARE_PROVIDER_SITE_OTHER)

## 2024-09-30 VITALS — BP 137/83 | HR 79 | Temp 98.4°F | Resp 16 | Ht 60.0 in | Wt 114.4 lb

## 2024-09-30 DIAGNOSIS — D509 Iron deficiency anemia, unspecified: Secondary | ICD-10-CM

## 2024-09-30 DIAGNOSIS — E611 Iron deficiency: Secondary | ICD-10-CM

## 2024-09-30 MED ORDER — SODIUM CHLORIDE 0.9 % IV SOLN
510.0000 mg | Freq: Once | INTRAVENOUS | Status: AC
  Administered 2024-09-30: 510 mg via INTRAVENOUS
  Filled 2024-09-30: qty 17

## 2024-09-30 NOTE — Progress Notes (Signed)
 Diagnosis: Iron Deficiency Anemia  Provider:  Mannam, Praveen MD  Procedure: IV Infusion  IV Type: Peripheral, IV Location: L Antecubital  Feraheme (Ferumoxytol), Dose: 510 mg  Infusion Start Time: 0837  Infusion Stop Time: 0853  Post Infusion IV Care: Observation period completed and Peripheral IV Discontinued  Discharge: Condition: Stable, Destination: Home . AVS Provided  Performed by:  Rocky FORBES Sar, RN

## 2024-10-07 ENCOUNTER — Ambulatory Visit (INDEPENDENT_AMBULATORY_CARE_PROVIDER_SITE_OTHER)

## 2024-10-07 VITALS — BP 120/79 | HR 73 | Temp 99.0°F | Resp 16 | Ht 60.0 in | Wt 114.8 lb

## 2024-10-07 DIAGNOSIS — D509 Iron deficiency anemia, unspecified: Secondary | ICD-10-CM

## 2024-10-07 DIAGNOSIS — E611 Iron deficiency: Secondary | ICD-10-CM

## 2024-10-07 MED ORDER — SODIUM CHLORIDE 0.9 % IV SOLN
510.0000 mg | Freq: Once | INTRAVENOUS | Status: AC
  Administered 2024-10-07: 510 mg via INTRAVENOUS
  Filled 2024-10-07: qty 17

## 2024-10-07 NOTE — Progress Notes (Signed)
 Diagnosis: Iron Deficiency Anemia  Provider:  Mannam, Praveen MD  Procedure: IV Infusion  IV Type: Peripheral, IV Location: L Antecubital  Feraheme  (Ferumoxytol ), Dose: 510 mg  Infusion Start Time: 0831  Infusion Stop Time: 0848  Post Infusion IV Care: Observation period completed and Peripheral IV Discontinued  Discharge: Condition: Stable, Destination: Home . AVS Provided  Performed by:  Rocky FORBES Sar, RN

## 2024-11-03 ENCOUNTER — Inpatient Hospital Stay

## 2024-11-03 ENCOUNTER — Inpatient Hospital Stay: Admitting: Nurse Practitioner
